# Patient Record
Sex: Female | Born: 1945 | Race: White | Marital: Married | Smoking: Never smoker
Health system: Southern US, Community
[De-identification: ages and names within clinical notes are randomized; demographics above are authoritative.]

## PROBLEM LIST (undated history)

## (undated) DIAGNOSIS — K659 Peritonitis, unspecified: Secondary | ICD-10-CM

## (undated) DIAGNOSIS — R42 Dizziness and giddiness: Secondary | ICD-10-CM

## (undated) HISTORY — DX: Peritonitis, unspecified: K65.9

## (undated) HISTORY — DX: Dizziness and giddiness: R42

## (undated) HISTORY — PX: ELBOW ARTHROTOMY: SUR103

## (undated) HISTORY — PX: TOE SURGERY: SHX1073

## (undated) HISTORY — PX: SHOULDER ARTHROSCOPY: SHX128

## (undated) HISTORY — PX: OTHER SURGICAL HISTORY: SHX169

---

## 2008-02-19 DIAGNOSIS — K659 Peritonitis, unspecified: Secondary | ICD-10-CM

## 2008-02-19 HISTORY — PX: COLECTOMY: SHX59

## 2008-02-19 HISTORY — PX: JOINT REPLACEMENT: SHX530

## 2008-02-19 HISTORY — DX: Peritonitis, unspecified: K65.9

## 2010-02-18 HISTORY — PX: JOINT REPLACEMENT: SHX530

## 2011-02-19 HISTORY — PX: JOINT REPLACEMENT: SHX530

## 2013-03-02 DIAGNOSIS — H04129 Dry eye syndrome of unspecified lacrimal gland: Secondary | ICD-10-CM | POA: Diagnosis not present

## 2013-03-02 DIAGNOSIS — H43819 Vitreous degeneration, unspecified eye: Secondary | ICD-10-CM | POA: Diagnosis not present

## 2013-03-02 DIAGNOSIS — H2510 Age-related nuclear cataract, unspecified eye: Secondary | ICD-10-CM | POA: Diagnosis not present

## 2013-06-14 DIAGNOSIS — L259 Unspecified contact dermatitis, unspecified cause: Secondary | ICD-10-CM | POA: Diagnosis not present

## 2013-07-14 DIAGNOSIS — L819 Disorder of pigmentation, unspecified: Secondary | ICD-10-CM | POA: Diagnosis not present

## 2013-11-18 DIAGNOSIS — R42 Dizziness and giddiness: Secondary | ICD-10-CM

## 2013-11-18 HISTORY — DX: Dizziness and giddiness: R42

## 2013-12-08 DIAGNOSIS — D692 Other nonthrombocytopenic purpura: Secondary | ICD-10-CM | POA: Diagnosis not present

## 2013-12-08 DIAGNOSIS — L821 Other seborrheic keratosis: Secondary | ICD-10-CM | POA: Diagnosis not present

## 2014-02-14 DIAGNOSIS — Z23 Encounter for immunization: Secondary | ICD-10-CM | POA: Diagnosis not present

## 2014-03-03 DIAGNOSIS — H43392 Other vitreous opacities, left eye: Secondary | ICD-10-CM | POA: Diagnosis not present

## 2014-03-03 DIAGNOSIS — H04123 Dry eye syndrome of bilateral lacrimal glands: Secondary | ICD-10-CM | POA: Diagnosis not present

## 2014-03-03 DIAGNOSIS — H2513 Age-related nuclear cataract, bilateral: Secondary | ICD-10-CM | POA: Diagnosis not present

## 2014-05-05 DIAGNOSIS — J019 Acute sinusitis, unspecified: Secondary | ICD-10-CM | POA: Diagnosis not present

## 2014-05-08 DIAGNOSIS — M1990 Unspecified osteoarthritis, unspecified site: Secondary | ICD-10-CM | POA: Diagnosis not present

## 2014-05-08 DIAGNOSIS — R42 Dizziness and giddiness: Secondary | ICD-10-CM | POA: Diagnosis not present

## 2014-05-08 DIAGNOSIS — H9319 Tinnitus, unspecified ear: Secondary | ICD-10-CM | POA: Diagnosis not present

## 2014-05-08 DIAGNOSIS — R112 Nausea with vomiting, unspecified: Secondary | ICD-10-CM | POA: Diagnosis not present

## 2014-05-08 DIAGNOSIS — Z136 Encounter for screening for cardiovascular disorders: Secondary | ICD-10-CM | POA: Diagnosis not present

## 2014-05-08 DIAGNOSIS — R0981 Nasal congestion: Secondary | ICD-10-CM | POA: Diagnosis not present

## 2014-05-09 DIAGNOSIS — R42 Dizziness and giddiness: Secondary | ICD-10-CM | POA: Diagnosis not present

## 2014-12-15 ENCOUNTER — Encounter: Payer: Self-pay | Admitting: Internal Medicine

## 2014-12-15 ENCOUNTER — Ambulatory Visit (INDEPENDENT_AMBULATORY_CARE_PROVIDER_SITE_OTHER): Payer: Medicare Other | Admitting: Internal Medicine

## 2014-12-15 VITALS — BP 124/76 | HR 76 | Temp 97.5°F | Resp 16 | Ht 63.0 in | Wt 149.8 lb

## 2014-12-15 DIAGNOSIS — R7309 Other abnormal glucose: Secondary | ICD-10-CM | POA: Diagnosis not present

## 2014-12-15 DIAGNOSIS — Z6826 Body mass index (BMI) 26.0-26.9, adult: Secondary | ICD-10-CM | POA: Diagnosis not present

## 2014-12-15 DIAGNOSIS — E559 Vitamin D deficiency, unspecified: Secondary | ICD-10-CM

## 2014-12-15 DIAGNOSIS — Z1389 Encounter for screening for other disorder: Secondary | ICD-10-CM

## 2014-12-15 DIAGNOSIS — IMO0001 Reserved for inherently not codable concepts without codable children: Secondary | ICD-10-CM

## 2014-12-15 DIAGNOSIS — R030 Elevated blood-pressure reading, without diagnosis of hypertension: Secondary | ICD-10-CM

## 2014-12-15 DIAGNOSIS — Z789 Other specified health status: Secondary | ICD-10-CM | POA: Diagnosis not present

## 2014-12-15 DIAGNOSIS — Z9181 History of falling: Secondary | ICD-10-CM

## 2014-12-15 DIAGNOSIS — Z79899 Other long term (current) drug therapy: Secondary | ICD-10-CM

## 2014-12-15 DIAGNOSIS — E7800 Pure hypercholesterolemia, unspecified: Secondary | ICD-10-CM | POA: Diagnosis not present

## 2014-12-15 DIAGNOSIS — Z1331 Encounter for screening for depression: Secondary | ICD-10-CM

## 2014-12-15 DIAGNOSIS — Z23 Encounter for immunization: Secondary | ICD-10-CM | POA: Diagnosis not present

## 2014-12-15 LAB — CBC WITH DIFFERENTIAL/PLATELET
BASOS ABS: 0.1 10*3/uL (ref 0.0–0.1)
Basophils Relative: 1 % (ref 0–1)
Eosinophils Absolute: 0.2 10*3/uL (ref 0.0–0.7)
Eosinophils Relative: 3 % (ref 0–5)
HCT: 38.2 % (ref 36.0–46.0)
HEMOGLOBIN: 12.2 g/dL (ref 12.0–15.0)
LYMPHS ABS: 1.5 10*3/uL (ref 0.7–4.0)
Lymphocytes Relative: 26 % (ref 12–46)
MCH: 24.1 pg — AB (ref 26.0–34.0)
MCHC: 31.9 g/dL (ref 30.0–36.0)
MCV: 75.3 fL — AB (ref 78.0–100.0)
MONOS PCT: 12 % (ref 3–12)
MPV: 9.7 fL (ref 8.6–12.4)
Monocytes Absolute: 0.7 10*3/uL (ref 0.1–1.0)
NEUTROS ABS: 3.4 10*3/uL (ref 1.7–7.7)
NEUTROS PCT: 58 % (ref 43–77)
PLATELETS: 291 10*3/uL (ref 150–400)
RBC: 5.07 MIL/uL (ref 3.87–5.11)
RDW: 19.4 % — ABNORMAL HIGH (ref 11.5–15.5)
WBC: 5.8 10*3/uL (ref 4.0–10.5)

## 2014-12-15 NOTE — Patient Instructions (Signed)

## 2014-12-16 LAB — BASIC METABOLIC PANEL WITH GFR
BUN: 11 mg/dL (ref 7–25)
CALCIUM: 9.4 mg/dL (ref 8.6–10.4)
CHLORIDE: 103 mmol/L (ref 98–110)
CO2: 28 mmol/L (ref 20–31)
CREATININE: 0.69 mg/dL (ref 0.60–0.93)
GFR, Est African American: >89 mL/min (ref >=60)
GFR, Est Non African American: 88 mL/min (ref >=60)
Glucose, Bld: 88 mg/dL (ref 65–99)
POTASSIUM: 4.1 mmol/L (ref 3.5–5.3)
SODIUM: 140 mmol/L (ref 135–146)

## 2014-12-16 LAB — LIPID PANEL
CHOL/HDL RATIO: 4.2 ratio (ref <=5.0)
CHOLESTEROL: 201 mg/dL — AB (ref 125–200)
HDL: 48 mg/dL (ref >=46)
LDL Cholesterol: 134 mg/dL — ABNORMAL HIGH (ref <130)
Triglycerides: 97 mg/dL (ref <150)
VLDL: 19 mg/dL (ref <30)

## 2014-12-16 LAB — HEPATIC FUNCTION PANEL
ALT: 17 U/L (ref 6–29)
AST: 18 U/L (ref 10–35)
Albumin: 4 g/dL (ref 3.6–5.1)
Alkaline Phosphatase: 48 U/L (ref 33–130)
BILIRUBIN DIRECT: 0.1 mg/dL (ref <=0.2)
BILIRUBIN INDIRECT: 0.2 mg/dL (ref 0.2–1.2)
BILIRUBIN TOTAL: 0.3 mg/dL (ref 0.2–1.2)
Total Protein: 6.6 g/dL (ref 6.1–8.1)

## 2014-12-16 LAB — INSULIN, RANDOM: Insulin: 8.1 u[IU]/mL (ref 2.0–19.6)

## 2014-12-16 LAB — URINALYSIS, ROUTINE W REFLEX MICROSCOPIC
BILIRUBIN URINE: NEGATIVE
Glucose, UA: NEGATIVE
Hgb urine dipstick: NEGATIVE
KETONES UR: NEGATIVE
Leukocytes, UA: NEGATIVE
Nitrite: NEGATIVE
PROTEIN: NEGATIVE
Specific Gravity, Urine: 1.011 (ref 1.001–1.035)
pH: 5.5 (ref 5.0–8.0)

## 2014-12-16 LAB — TSH: TSH: 1.286 u[IU]/mL (ref 0.350–4.500)

## 2014-12-16 LAB — HEMOGLOBIN A1C
Hgb A1c MFr Bld: 5.9 % — ABNORMAL HIGH (ref <5.7)
Mean Plasma Glucose: 123 mg/dL — ABNORMAL HIGH (ref <117)

## 2014-12-16 LAB — VITAMIN D 25 HYDROXY (VIT D DEFICIENCY, FRACTURES): VIT D 25 HYDROXY: 44 ng/mL (ref 30–100)

## 2014-12-16 LAB — MAGNESIUM: MAGNESIUM: 2.1 mg/dL (ref 1.5–2.5)

## 2014-12-24 ENCOUNTER — Encounter: Payer: Self-pay | Admitting: Internal Medicine

## 2014-12-24 DIAGNOSIS — E559 Vitamin D deficiency, unspecified: Secondary | ICD-10-CM | POA: Insufficient documentation

## 2014-12-24 DIAGNOSIS — E7800 Pure hypercholesterolemia, unspecified: Secondary | ICD-10-CM | POA: Insufficient documentation

## 2014-12-24 DIAGNOSIS — I10 Essential (primary) hypertension: Secondary | ICD-10-CM | POA: Insufficient documentation

## 2014-12-24 DIAGNOSIS — R7309 Other abnormal glucose: Secondary | ICD-10-CM | POA: Insufficient documentation

## 2014-12-24 DIAGNOSIS — Z6826 Body mass index (BMI) 26.0-26.9, adult: Secondary | ICD-10-CM | POA: Insufficient documentation

## 2014-12-24 MED ORDER — VITAMIN D3 250 MCG (10000 UT) PO CAPS: 10000.0000 [IU] | ORAL_CAPSULE | Freq: Every day | ORAL | Status: AC

## 2014-12-24 NOTE — Progress Notes (Signed)
Patient ID: Heather Barrera, female   DOB: Dec 13, 1945, 69 y.o.   MRN: [id number]     This very nice 69 y.o. MWF presents for new patient evaluation referred by her husband.   She's being screened & evaluated for elevated BP, lipids, abnormal glucose and Vit D deficiency.      Patient is uncertain if ever had elevated BP's. Today's BP: 124/76 mmHg. Patient has had no complaints of any cardiac type chest pain, palpitations, dyspnea/orthopnea/PND, dizziness, claudication, or dependent edema.     Patient does endorse hx/o advisement of elevated cholesterol in the past. And it was last checked about 2 years ago.  Patient denies myalgias or other med SE's. Today's Lipids are elevated with Cholesterol 201*; HDL 48; LDL 134*; and Triglycerides 97.     Also, the patient has uncertain history of PreDiabetes and denies symptoms of reactive hypoglycemia, diabetic polys, paresthesias or visual blurring.  Today's  A1c is elevated at 5.9% in the prediabetic range.        Further, the patient also has history of Vitamin D Deficiency and supplements vitamin D without any suspected side-effects. Today's vitamin D is low at 44.      Current Meds include Melatonin 8 mg qhs, 1/2 oz of ZZZQuil qhs, ASA 325 mg, Calcium-Magnesium-Zinc Supplement and Aleve prn.  Allergies  Allergen Reactions  . Penicillins Anaphylaxis  . Demerol [Meperidine] Nausea And Vomiting  . Betadine [Povidone Iodine] Rash    Past Medical History  Diagnosis Date  . Peritonitis (HCC) 2010    due to SBO  . Vertigo Oct 2015   Past Surgical History  Procedure Laterality Date  . Shoulder arthroscopy Left   . Elbow arthrotomy Left   . 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 Joint replacement Left 2012    Lt TKR  . Joint replacement Right 2013    Rt THR  . Thumb surg Right   . Toe surgery Bilateral     5th toes    Immunization History  Administered Date(s) Administered  . Influenza, High Dose Seasonal PF 12/15/2014  . Pneumococcal  Polysaccharide-23 02/18/2009  . Zoster 02/18/2009    FHx:    Reviewed / unchanged  SHx:    Reviewed / unchanged  Systems Review:  Constitutional: Denies fever, chills, wt changes, headaches, insomnia,  night sweats, change in appetite. Does c/o nonspecific fatigue. Eyes: Denies redness, blurred vision, diplopia, discharge, itchy, watery eyes.  ENT: Denies discharge, congestion, post nasal drip, epistaxis, sore throat, earache,  dental pain, tinnitus, vertigo, sinus pain, snoring. Reports decreased Lt sided hearing loss. CV: Denies chest pain, palpitations, irregular heartbeat, syncope, dyspnea, diaphoresis, orthopnea, PND, claudication or edema. Respiratory: denies cough, dyspnea, DOE, pleurisy, hoarseness, laryngitis, wheezing.  Gastrointestinal: Denies dysphagia, odynophagia, heartburn, reflux, water brash, abdominal pain or cramps, nausea, vomiting, bloating, diarrhea, constipation, hematemesis, melena, hematochezia  or hemorrhoids. Genitourinary: Denies dysuria, frequency, urgency, nocturia, hesitancy, discharge, hematuria or flank pain. Musculoskeletal: Denies arthralgias, myalgias, stiffness, jt. swelling, pain, limping or strain/sprain. Denies falls.  Skin: Denies pruritus, rash, hives, warts, acne, eczema or change in skin lesion(s). Neuro: No weakness, tremor, incoordination, spasms, paresthesia or pain. Psychiatric: Denies confusion, memory loss or sensory loss. Denies depression.  Endo: Denies change in weight, skin or hair change.  Heme/Lymph: No excessive bleeding, bruising or enlarged lymph nodes.  Physical Exam  BP 124/76 mmHg  Pulse 76  Temp(Src) 97.5 F (36.4 C)  Resp 16  Ht 5\' 3"  (1.6 m)  Wt 149  lb 12.8 oz (67.949 kg)  BMI 26.54 kg/m2  Appears well nourished and in no distress. Eyes: PERRLA, EOMs, conjunctiva no swelling or erythema. Sinuses: No frontal/maxillary tenderness ENT/Mouth: EAC's clear, TM's nl w/o erythema, bulging. Nares clear w/o erythema,  swelling, exudates. Oropharynx clear without erythema or exudates. Oral hygiene is good. Tongue normal, non obstructing. Hearing intact.  Neck: Supple. Thyroid nl. Car 2+/2+ without bruits, nodes or JVD. Chest: Respirations nl with BS clear & equal w/o rales, rhonchi, wheezing or stridor.  Cor: Heart sounds normal w/ regular rate and rhythm without sig. murmurs, gallops, clicks, or rubs. Peripheral pulses normal and equal  without edema.  Abdomen: Soft & bowel sounds normal. Non-tender w/o guarding, rebound, hernias, masses, or organomegaly.  Lymphatics: Unremarkable.  Musculoskeletal: Full ROM all peripheral extremities, joint stability, 5/5 strength, and normal gait.  Skin: Warm, dry without exposed rashes, lesions or ecchymosis apparent.  Neuro: Cranial nerves intact, reflexes equal bilaterally. Sensory-motor testing grossly intact. Monofilament testing Nl.  Tendon reflexes grossly intact.  Pysch: Alert & oriented x 3.  Insight and judgement nl & appropriate. No ideations.  Assessment and Plan:  1. Elevated BP, screening   2. Elevated cholesterol, screening  - Lipid panel - TSH  3. PreDiabetes  - Hemoglobin A1c - Insulin, random  4. Vitamin D deficiency  - Vit D  25 hydroxy   5. Need for prophylactic vaccination and inoculation against influenza  - Flu vaccine HIGH DOSE PF (Fluzone High dose)  6. BMI 26.0-26.9,adult   7. Medication management  - CBC with Differential/Platelet - BASIC METABOLIC PANEL WITH GFR - Hepatic function panel - Magnesium - Urinalysis, Routine w reflex microscopic    Recommended regular exercise, BP monitoring, weight control, and discussed med and SE's. Recommended labs to assess and monitor clinical status. Further disposition pending results of labs. Over 30 minutes of exam, counseling, chart review was performed

## 2015-03-23 ENCOUNTER — Ambulatory Visit: Payer: Self-pay | Admitting: Physician Assistant

## 2015-03-23 ENCOUNTER — Ambulatory Visit (INDEPENDENT_AMBULATORY_CARE_PROVIDER_SITE_OTHER): Payer: Medicare Other | Admitting: Internal Medicine

## 2015-03-23 ENCOUNTER — Encounter: Payer: Self-pay | Admitting: Internal Medicine

## 2015-03-23 VITALS — BP 136/80 | HR 82 | Temp 97.8°F | Resp 16 | Ht 63.0 in | Wt 155.0 lb

## 2015-03-23 DIAGNOSIS — Z79899 Other long term (current) drug therapy: Secondary | ICD-10-CM | POA: Diagnosis not present

## 2015-03-23 DIAGNOSIS — IMO0001 Reserved for inherently not codable concepts without codable children: Secondary | ICD-10-CM

## 2015-03-23 DIAGNOSIS — E7800 Pure hypercholesterolemia, unspecified: Secondary | ICD-10-CM

## 2015-03-23 DIAGNOSIS — R030 Elevated blood-pressure reading, without diagnosis of hypertension: Secondary | ICD-10-CM | POA: Diagnosis not present

## 2015-03-23 LAB — LIPID PANEL
CHOL/HDL RATIO: 3.5 ratio (ref <=5.0)
CHOLESTEROL: 213 mg/dL — AB (ref 125–200)
HDL: 61 mg/dL (ref >=46)
LDL Cholesterol: 136 mg/dL — ABNORMAL HIGH (ref <130)
TRIGLYCERIDES: 78 mg/dL (ref <150)
VLDL: 16 mg/dL (ref <30)

## 2015-03-23 LAB — HEPATIC FUNCTION PANEL
ALK PHOS: 51 U/L (ref 33–130)
ALT: 20 U/L (ref 6–29)
AST: 24 U/L (ref 10–35)
Albumin: 4.2 g/dL (ref 3.6–5.1)
BILIRUBIN DIRECT: 0.1 mg/dL (ref <=0.2)
BILIRUBIN TOTAL: 0.3 mg/dL (ref 0.2–1.2)
Indirect Bilirubin: 0.2 mg/dL (ref 0.2–1.2)
Total Protein: 6.6 g/dL (ref 6.1–8.1)

## 2015-03-23 LAB — CBC WITH DIFFERENTIAL/PLATELET
BASOS ABS: 0.1 10*3/uL (ref 0.0–0.1)
Basophils Relative: 1 % (ref 0–1)
EOS ABS: 0.1 10*3/uL (ref 0.0–0.7)
Eosinophils Relative: 2 % (ref 0–5)
HCT: 41.1 % (ref 36.0–46.0)
HEMOGLOBIN: 13.2 g/dL (ref 12.0–15.0)
LYMPHS ABS: 1.5 10*3/uL (ref 0.7–4.0)
Lymphocytes Relative: 28 % (ref 12–46)
MCH: 26.6 pg (ref 26.0–34.0)
MCHC: 32.1 g/dL (ref 30.0–36.0)
MCV: 82.7 fL (ref 78.0–100.0)
MONOS PCT: 14 % — AB (ref 3–12)
MPV: 10 fL (ref 8.6–12.4)
Monocytes Absolute: 0.8 10*3/uL (ref 0.1–1.0)
NEUTROS ABS: 3 10*3/uL (ref 1.7–7.7)
NEUTROS PCT: 55 % (ref 43–77)
PLATELETS: 308 10*3/uL (ref 150–400)
RBC: 4.97 MIL/uL (ref 3.87–5.11)
RDW: 17.8 % — ABNORMAL HIGH (ref 11.5–15.5)
WBC: 5.4 10*3/uL (ref 4.0–10.5)

## 2015-03-23 LAB — BASIC METABOLIC PANEL WITH GFR
BUN: 10 mg/dL (ref 7–25)
CHLORIDE: 104 mmol/L (ref 98–110)
CO2: 28 mmol/L (ref 20–31)
Calcium: 9.7 mg/dL (ref 8.6–10.4)
Creat: 0.71 mg/dL (ref 0.60–0.93)
GFR, Est African American: >89 mL/min (ref >=60)
GFR, Est Non African American: 86 mL/min (ref >=60)
Glucose, Bld: 94 mg/dL (ref 65–99)
POTASSIUM: 4.2 mmol/L (ref 3.5–5.3)
SODIUM: 142 mmol/L (ref 135–146)

## 2015-03-23 LAB — TSH: TSH: 2.632 u[IU]/mL (ref 0.350–4.500)

## 2015-03-23 NOTE — Progress Notes (Signed)
Patient ID: Heather Barrera, female   DOB: 16-Oct-1945, 70 y.o.   MRN: [id number]  Assessment and Plan:  Hypertension:  -Continue medication,  -monitor blood pressure at home.  -Continue DASH diet.   -Reminder to go to the ER if any CP, SOB, nausea, dizziness, severe HA, changes vision/speech, left arm numbness and tingling, and jaw pain.  Cholesterol: -Continue diet and exercise.  -Check cholesterol.   Pre-diabetes: -Continue diet and exercise.  -Check A1C  Vitamin D Def: -check level -continue medications.   Continue diet and meds as discussed. Further disposition pending results of labs.  HPI 70 y.o. female  presents for 3 month follow up with hypertension, hyperlipidemia, prediabetes and vitamin D.   Her blood pressure has been controlled at home, today their BP is BP: 136/80 mmHg.   She does workout. She denies chest pain, shortness of breath, dizziness.  She is walking and she is using a treadmill.  She is working to lose weight.     She is not on cholesterol medication and denies myalgias. Her cholesterol is at goal. The cholesterol last visit was:   Lab Results  Component Value Date   CHOL 201* 12/15/2014   HDL 48 12/15/2014   LDLCALC 134* 12/15/2014   TRIG 97 12/15/2014   CHOLHDL 4.2 12/15/2014     She has been working on diet and exercise for prediabetes, and denies foot ulcerations, hyperglycemia, hypoglycemia , increased appetite, nausea, paresthesia of the feet, polydipsia, polyuria, visual disturbances, vomiting and weight loss. Last A1C in the office was:  Lab Results  Component Value Date   HGBA1C 5.9* 12/15/2014    Patient is on Vitamin D supplement.  Lab Results  Component Value Date   VD25OH 44 12/15/2014      Current Medications:  Current Outpatient Prescriptions on File Prior to Visit  Medication Sig Dispense Refill  . aspirin 325 MG EC tablet Take 325 mg by mouth daily.    . Cholecalciferol (VITAMIN D3) 10000 UNITS capsule Take 1 capsule (10,000  Units total) by mouth daily.    . Ferrous Fumarate (IRON) 18 MG TBCR Take 1 tablet by mouth daily.    Marland Kitchen GARLIC PO Take 1 capsule by mouth daily.    Marland Kitchen MELATONIN PO Take 8 mg by mouth daily. At bedtime    . Multiple Vitamins-Minerals (MULTIVITAMIN PO) Take 1 tablet by mouth daily.    . naproxen sodium (ANAPROX) 220 MG tablet Take 220 mg by mouth 2 (two) times daily with a meal. PRN    . OVER THE COUNTER MEDICATION CoQ10 200 mg daily    . OVER THE COUNTER MEDICATION Z Quil 1/2 ounce at hs    . OVER THE COUNTER MEDICATION Calcium-magnesium-zinc 1 tablet daily.    . vitamin E 400 UNIT capsule Take 400 Units by mouth daily.     No current facility-administered medications on file prior to visit.    Medical History:  Past Medical History  Diagnosis Date  . Peritonitis (HCC) 2010    due to SBO  . Vertigo Oct 2015    Allergies:  Allergies  Allergen Reactions  . Penicillins Anaphylaxis  . Demerol [Meperidine] Nausea And Vomiting  . Betadine [Povidone Iodine] Rash     Review of Systems:  Review of Systems  Constitutional: Negative for fever, chills and malaise/fatigue.  HENT: Negative for congestion, ear pain and sore throat.   Respiratory: Negative for cough, shortness of breath and wheezing.   Cardiovascular: Negative for chest pain, palpitations and  leg swelling.  Gastrointestinal: Negative for heartburn, diarrhea, constipation, blood in stool and melena.  Genitourinary: Negative.   Skin: Negative.   Neurological: Negative for dizziness, sensory change, loss of consciousness and headaches.  Psychiatric/Behavioral: Negative for depression. The patient is not nervous/anxious and does not have insomnia.     Family history- Review and unchanged  Social history- Review and unchanged  Physical Exam: BP 136/80 mmHg  Pulse 82  Temp(Src) 97.8 F (36.6 C) (Temporal)  Resp 16  Ht  (1.6 m)  Wt 155 lb (70.308 kg)  BMI 27.46 kg/m2 Wt Readings from Last 3 Encounters:  03/23/15  155 lb (70.308 kg)  12/15/14 149 lb 12.8 oz (67.949 kg)    General Appearance: Well nourished well developed, in no apparent distress. Eyes: PERRLA, EOMs, conjunctiva no swelling or erythema ENT/Mouth: Ear canals normal without obstruction, swelling, erythma, discharge.  TMs normal bilaterally.  Oropharynx moist, clear, without exudate, or postoropharyngeal swelling. Neck: Supple, thyroid normal,no cervical adenopathy  Respiratory: Respiratory effort normal, Breath sounds clear A&P without rhonchi, wheeze, or rale.  No retractions, no accessory usage. Cardio: RRR with no MRGs. Brisk peripheral pulses without edema.  Abdomen: Soft, + BS,  Non tender, no guarding, rebound, hernias, masses. Musculoskeletal: Full ROM, 5/5 strength, Normal gait Skin: Warm, dry without rashes, lesions, ecchymosis.  Neuro: Awake and oriented X 3, Cranial nerves intact. Normal muscle tone, no cerebellar symptoms. Psych: Normal affect, Insight and Judgment appropriate.    Terri Piedra, PA-C 3:34 PM [facility]

## 2015-04-18 DIAGNOSIS — H903 Sensorineural hearing loss, bilateral: Secondary | ICD-10-CM | POA: Diagnosis not present

## 2015-10-05 ENCOUNTER — Ambulatory Visit (INDEPENDENT_AMBULATORY_CARE_PROVIDER_SITE_OTHER): Payer: Medicare Other | Admitting: Internal Medicine

## 2015-10-05 ENCOUNTER — Encounter: Payer: Self-pay | Admitting: Internal Medicine

## 2015-10-05 VITALS — BP 126/70 | HR 62 | Resp 14 | Ht 63.0 in | Wt 149.8 lb

## 2015-10-05 DIAGNOSIS — Z79899 Other long term (current) drug therapy: Secondary | ICD-10-CM | POA: Diagnosis not present

## 2015-10-05 DIAGNOSIS — Z23 Encounter for immunization: Secondary | ICD-10-CM | POA: Diagnosis not present

## 2015-10-05 DIAGNOSIS — Z6826 Body mass index (BMI) 26.0-26.9, adult: Secondary | ICD-10-CM

## 2015-10-05 DIAGNOSIS — R7309 Other abnormal glucose: Secondary | ICD-10-CM

## 2015-10-05 DIAGNOSIS — Z1159 Encounter for screening for other viral diseases: Secondary | ICD-10-CM | POA: Diagnosis not present

## 2015-10-05 DIAGNOSIS — E7800 Pure hypercholesterolemia, unspecified: Secondary | ICD-10-CM

## 2015-10-05 DIAGNOSIS — R030 Elevated blood-pressure reading, without diagnosis of hypertension: Secondary | ICD-10-CM

## 2015-10-05 DIAGNOSIS — E559 Vitamin D deficiency, unspecified: Secondary | ICD-10-CM | POA: Diagnosis not present

## 2015-10-05 DIAGNOSIS — IMO0001 Reserved for inherently not codable concepts without codable children: Secondary | ICD-10-CM

## 2015-10-05 LAB — LIPID PANEL
CHOL/HDL RATIO: 3.3 ratio (ref <=5.0)
CHOLESTEROL: 190 mg/dL (ref 125–200)
HDL: 58 mg/dL (ref >=46)
LDL Cholesterol: 115 mg/dL (ref <130)
TRIGLYCERIDES: 84 mg/dL (ref <150)
VLDL: 17 mg/dL (ref <30)

## 2015-10-05 LAB — HEMOGLOBIN A1C
Hgb A1c MFr Bld: 5.2 % (ref <5.7)
MEAN PLASMA GLUCOSE: 103 mg/dL

## 2015-10-05 LAB — BASIC METABOLIC PANEL WITH GFR
BUN: 15 mg/dL (ref 7–25)
CALCIUM: 9.8 mg/dL (ref 8.6–10.4)
CHLORIDE: 105 mmol/L (ref 98–110)
CO2: 24 mmol/L (ref 20–31)
CREATININE: 0.72 mg/dL (ref 0.60–0.93)
GFR, Est Non African American: 85 mL/min (ref >=60)
Glucose, Bld: 81 mg/dL (ref 65–99)
Potassium: 4.1 mmol/L (ref 3.5–5.3)
SODIUM: 139 mmol/L (ref 135–146)

## 2015-10-05 LAB — CBC WITH DIFFERENTIAL/PLATELET
BASOS ABS: 0 {cells}/uL (ref 0–200)
Basophils Relative: 0 %
EOS ABS: 94 {cells}/uL (ref 15–500)
EOS PCT: 2 %
HCT: 41.4 % (ref 35.0–45.0)
Hemoglobin: 13.7 g/dL (ref 11.7–15.5)
LYMPHS PCT: 18 %
Lymphs Abs: 846 cells/uL — ABNORMAL LOW (ref 850–3900)
MCH: 29.7 pg (ref 27.0–33.0)
MCHC: 33.1 g/dL (ref 32.0–36.0)
MCV: 89.8 fL (ref 80.0–100.0)
MONOS PCT: 15 %
MPV: 9.9 fL (ref 7.5–12.5)
Monocytes Absolute: 705 cells/uL (ref 200–950)
NEUTROS ABS: 3055 {cells}/uL (ref 1500–7800)
Neutrophils Relative %: 65 %
PLATELETS: 275 10*3/uL (ref 140–400)
RBC: 4.61 MIL/uL (ref 3.80–5.10)
RDW: 16.6 % — AB (ref 11.0–15.0)
WBC: 4.7 10*3/uL (ref 3.8–10.8)

## 2015-10-05 LAB — HEPATIC FUNCTION PANEL
ALBUMIN: 4.2 g/dL (ref 3.6–5.1)
ALT: 17 U/L (ref 6–29)
AST: 22 U/L (ref 10–35)
Alkaline Phosphatase: 36 U/L (ref 33–130)
BILIRUBIN DIRECT: 0.1 mg/dL (ref <=0.2)
BILIRUBIN TOTAL: 0.4 mg/dL (ref 0.2–1.2)
Indirect Bilirubin: 0.3 mg/dL (ref 0.2–1.2)
Total Protein: 7 g/dL (ref 6.1–8.1)

## 2015-10-05 LAB — TSH: TSH: 2.88 mIU/L

## 2015-10-05 NOTE — Progress Notes (Signed)
Assessment and Plan:  Hypertension:  -Continue medication,  -monitor blood pressure at home.  -Continue DASH diet.   -Reminder to go to the ER if any CP, SOB, nausea, dizziness, severe HA, changes vision/speech, left arm numbness and tingling, and jaw pain.  Cholesterol: -Continue diet and exercise.  -Check cholesterol.   Pre-diabetes: -Continue diet and exercise.  -Check A1C  Vitamin D Def: -check level -continue medications.   Hepatitis C screening -hep c antibody  BMI 26 -cont diet and exercise -refused phentermine prescription  Continue diet and meds as discussed. Further disposition pending results of labs.  HPI 70 y.o. female  presents for 3 month follow up with hypertension, hyperlipidemia, prediabetes and vitamin D.   Her blood pressure has been controlled at home, today their BP is BP: 126/70.   She does workout. She denies chest pain, shortness of breath, dizziness.   She is on cholesterol medication and denies myalgias. Her cholesterol is at goal. The cholesterol last visit was:   Lab Results  Component Value Date   CHOL 213 (H) 03/23/2015   HDL 61 03/23/2015   LDLCALC 136 (H) 03/23/2015   TRIG 78 03/23/2015   CHOLHDL 3.5 03/23/2015     She has been working on diet and exercise for prediabetes, and denies foot ulcerations, hyperglycemia, hypoglycemia , increased appetite, nausea, paresthesia of the feet, polydipsia, polyuria, visual disturbances, vomiting and weight loss. Last A1C in the office was:  Lab Results  Component Value Date   HGBA1C 5.9 (H) 12/15/2014    Patient is on Vitamin D supplement.  Lab Results  Component Value Date   VD25OH 44 12/15/2014     She has no complaints today.  She would like to go ahead and have the hepatitis C test she has been seeing on TV.  She is working on losing weight for an upcoming wedding for one of her grandkids   Current Medications:  Current Outpatient Prescriptions on File Prior to Visit  Medication  Sig Dispense Refill  . aspirin 325 MG EC tablet Take 325 mg by mouth daily.    . Cholecalciferol (VITAMIN D3) 10000 UNITS capsule Take 1 capsule (10,000 Units total) by mouth daily.    . Ferrous Fumarate (IRON) 18 MG TBCR Take 1 tablet by mouth daily.    Marland Kitchen. GARLIC PO Take 1 capsule by mouth daily.    Marland Kitchen. MELATONIN PO Take 8 mg by mouth daily. At bedtime    . Multiple Vitamins-Minerals (MULTIVITAMIN PO) Take 1 tablet by mouth daily.    . naproxen sodium (ANAPROX) 220 MG tablet Take 220 mg by mouth 2 (two) times daily with a meal. PRN    . OVER THE COUNTER MEDICATION CoQ10 200 mg daily    . OVER THE COUNTER MEDICATION Z Quil 1/2 ounce at hs    . OVER THE COUNTER MEDICATION Calcium-magnesium-zinc 1 tablet daily.    . vitamin E 400 UNIT capsule Take 400 Units by mouth daily.     No current facility-administered medications on file prior to visit.     Medical History:  Past Medical History:  Diagnosis Date  . Peritonitis (HCC) 2010   due to SBO  . Vertigo Oct 2015    Allergies:  Allergies  Allergen Reactions  . Penicillins Anaphylaxis  . Demerol [Meperidine] Nausea And Vomiting  . Betadine [Povidone Iodine] Rash     Review of Systems:  Review of Systems  Constitutional: Negative for chills, fever and malaise/fatigue.  HENT: Negative for congestion, ear  pain and sore throat.   Eyes: Negative.   Respiratory: Negative for cough, shortness of breath and wheezing.   Cardiovascular: Negative for chest pain, palpitations and leg swelling.  Gastrointestinal: Negative for abdominal pain, blood in stool, constipation, diarrhea, heartburn and melena.  Genitourinary: Negative.   Skin: Negative.   Neurological: Negative for dizziness, sensory change, loss of consciousness and headaches.  Psychiatric/Behavioral: Negative for depression. The patient is not nervous/anxious and does not have insomnia.     Family history- Review and unchanged  Social history- Review and unchanged  Physical  Exam: BP 126/70   Pulse 62   Resp 14   Ht 5\' 3"  (1.6 m)   Wt 149 lb 12.8 oz (67.9 kg)   SpO2 98%   BMI 26.54 kg/m  Wt Readings from Last 3 Encounters:  10/05/15 149 lb 12.8 oz (67.9 kg)  03/23/15 155 lb (70.3 kg)  12/15/14 149 lb 12.8 oz (67.9 kg)    General Appearance: Well nourished well developed, in no apparent distress. Eyes: PERRLA, EOMs, conjunctiva no swelling or erythema ENT/Mouth: Ear canals normal without obstruction, swelling, erythma, discharge.  TMs normal bilaterally.  Oropharynx moist, clear, without exudate, or postoropharyngeal swelling. Neck: Supple, thyroid normal,no cervical adenopathy  Respiratory: Respiratory effort normal, Breath sounds clear A&P without rhonchi, wheeze, or rale.  No retractions, no accessory usage. Cardio: RRR with no MRGs. Brisk peripheral pulses without edema.  Abdomen: Soft, + BS,  Non tender, no guarding, rebound, hernias, masses. Musculoskeletal: Full ROM, 5/5 strength, Normal gait Skin: Warm, dry without rashes, lesions, ecchymosis.  Neuro: Awake and oriented X 3, Cranial nerves intact. Normal muscle tone, no cerebellar symptoms. Psych: Normal affect, Insight and Judgment appropriate.    Terri Piedraourtney Forcucci, PA-C 4:16 PM [facility]

## 2015-10-06 LAB — HEPATITIS C ANTIBODY: HCV Ab: NEGATIVE

## 2016-03-13 DIAGNOSIS — M25551 Pain in right hip: Secondary | ICD-10-CM | POA: Diagnosis not present

## 2016-03-13 DIAGNOSIS — M25571 Pain in right ankle and joints of right foot: Secondary | ICD-10-CM | POA: Diagnosis not present

## 2016-03-13 DIAGNOSIS — M25561 Pain in right knee: Secondary | ICD-10-CM | POA: Diagnosis not present

## 2016-03-20 DIAGNOSIS — M25551 Pain in right hip: Secondary | ICD-10-CM | POA: Diagnosis not present

## 2016-03-20 DIAGNOSIS — R6889 Other general symptoms and signs: Secondary | ICD-10-CM | POA: Diagnosis not present

## 2016-03-27 DIAGNOSIS — R6889 Other general symptoms and signs: Secondary | ICD-10-CM | POA: Diagnosis not present

## 2016-03-27 DIAGNOSIS — M25551 Pain in right hip: Secondary | ICD-10-CM | POA: Diagnosis not present

## 2016-04-03 DIAGNOSIS — R6889 Other general symptoms and signs: Secondary | ICD-10-CM | POA: Diagnosis not present

## 2016-04-03 DIAGNOSIS — M25551 Pain in right hip: Secondary | ICD-10-CM | POA: Diagnosis not present

## 2016-04-24 DIAGNOSIS — M25551 Pain in right hip: Secondary | ICD-10-CM | POA: Diagnosis not present

## 2016-06-03 ENCOUNTER — Ambulatory Visit: Payer: Self-pay | Admitting: Internal Medicine

## 2016-06-05 ENCOUNTER — Ambulatory Visit: Payer: Self-pay | Admitting: Physician Assistant

## 2016-06-16 NOTE — Progress Notes (Signed)
MEDICARE ANNUAL WELLNESS VISIT AND CPE  Assessment:    Hypertension, unspecified type - continue medications, DASH diet, exercise and monitor at home. Call if greater than 130/80.  -     CBC with Differential/Platelet -     BASIC METABOLIC PANEL WITH GFR -     Hepatic function panel -     TSH  Elevated cholesterol -continue medications, check lipids, decrease fatty foods, increase activity.  -     Lipid panel  Other abnormal glucose Discussed general issues about diabetes pathophysiology and management., Educational material distributed., Suggested low cholesterol diet., Encouraged aerobic exercise., Discussed foot care., Reminded to get yearly retinal exam.  Vitamin D deficiency  BMI 26.0-26.9,adult  Medication management -     Magnesium  Encounter for Medicare annual wellness exam  Estrogen deficiency -     DG Bone Density; Future  Venous stasis dermatitis of right lower extremity -     triamcinolone cream (KENALOG) 0.5 %; Apply 1 application topically 2 (two) times daily.   Over 40 minutes of exam, counseling, chart review and critical decision making was performed No future appointments.   Plan:   During the course of the visit the patient was educated and counseled about appropriate screening and preventive services including:    Pneumococcal vaccine   Prevnar 13  Influenza vaccine  Td vaccine  Screening electrocardiogram  Bone densitometry screening  Colorectal cancer screening  Diabetes screening  Glaucoma screening  Nutrition counseling   Advanced directives: requested   Subjective:  Heather Barrera is a 71 y.o. female who presents for Medicare Annual Wellness Visit and complete physical.    May had fall, saw Dr. Glynda Jaeger, got cortisone injection in bursa which helped.  Her blood pressure has been controlled at home, today their BP is BP: 120/74  She does workout. She denies chest pain, shortness of breath, dizziness.  She is not on  cholesterol medication and denies myalgias. Her cholesterol is at goal. The cholesterol last visit was:   Lab Results  Component Value Date   CHOL 190 10/05/2015   HDL 58 10/05/2015   LDLCALC 115 10/05/2015   TRIG 84 10/05/2015   CHOLHDL 3.3 10/05/2015    She has been working on diet and exercise for prediabetes, and denies paresthesia of the feet, polydipsia, polyuria and visual disturbances. Last A1C in the office was:  Lab Results  Component Value Date   HGBA1C 5.2 10/05/2015   Last GFR:  Lab Results  Component Value Date   GFRNONAA 85 10/05/2015   Patient is on Vitamin D supplement.   Lab Results  Component Value Date   VD25OH 44 12/15/2014     BMI is Body mass index is 26.47 kg/m., she is working on diet and exercise. Wt Readings from Last 3 Encounters:  06/17/16 149 lb 6.4 oz (67.8 kg)  10/05/15 149 lb 12.8 oz (67.9 kg)  03/23/15 155 lb (70.3 kg)    Medication Review: Current Outpatient Prescriptions on File Prior to Visit  Medication Sig Dispense Refill  . aspirin 325 MG EC tablet Take 325 mg by mouth daily.    . Cholecalciferol (VITAMIN D3) 10000 UNITS capsule Take 1 capsule (10,000 Units total) by mouth daily.    . Ferrous Fumarate (IRON) 18 MG TBCR Take 1 tablet by mouth daily.    Marland Kitchen GARLIC PO Take 1 capsule by mouth daily.    . Multiple Vitamins-Minerals (MULTIVITAMIN PO) Take 1 tablet by mouth daily.    . naproxen sodium (ANAPROX)  220 MG tablet Take 220 mg by mouth 2 (two) times daily with a meal. PRN    . OVER THE COUNTER MEDICATION CoQ10 200 mg daily    . OVER THE COUNTER MEDICATION Z Quil 1/2 ounce at hs    . OVER THE COUNTER MEDICATION Calcium-magnesium-zinc 1 tablet daily.    . vitamin E 400 UNIT capsule Take 400 Units by mouth daily.     No current facility-administered medications on file prior to visit.     Allergies  Allergen Reactions  . Penicillins Anaphylaxis  . Demerol [Meperidine] Nausea And Vomiting  . Betadine [Povidone Iodine] Rash     Current Problems (verified) Patient Active Problem List   Diagnosis Date Noted  . Hypertension 12/24/2014  . Elevated cholesterol 12/24/2014  . Other abnormal glucose 12/24/2014  . Vitamin D deficiency 12/24/2014  . BMI 26.0-26.9,adult 12/24/2014    Screening Tests Immunization History  Administered Date(s) Administered  . Influenza, High Dose Seasonal PF 12/15/2014, 10/05/2015  . Pneumococcal Conjugate-13 10/05/2015  . Pneumococcal Polysaccharide-23 02/18/2009  . Zoster 02/18/2009    Preventative care: Last colonoscopy: 2010, had ruptured colon and had colectomy Last mammogram: 5 years  Last pap smear/pelvic exam: remote   DEXA:   Prior vaccinations: TD or Tdap: declines  Influenza: 2017 Pneumococcal: 2011 Prevnar13: 2017 Shingles/Zostavax: 2011  Names of Other Physician/Practitioners you currently use: 1.  [facility] here for primary care 2. Jess Barters, eye doctor, last visit July 2016 Patient Care Team: Lucky Cowboy, MD as PCP - General (Internal Medicine)  SURGICAL HISTORY She  has a past surgical history that includes Shoulder arthroscopy (Left); Elbow arthrotomy (Left); Joint replacement (Right, 2010); Joint replacement (Left, 2012); Joint replacement (Right, 2013); thumb surg (Right); Toe Surgery (Bilateral); and Colectomy (2010). FAMILY HISTORY Her family history includes AAA (abdominal aortic aneurysm) in her sister; Cancer in her father and sister; Diabetes in her mother; Heart disease in her mother; Heart disease (age of onset: 12) in her brother; Hyperlipidemia in her mother. SOCIAL HISTORY She  reports that she has never smoked. She has never used smokeless tobacco. She reports that she drinks alcohol.   MEDICARE WELLNESS OBJECTIVES: Physical activity: Current Exercise Habits: Home exercise routine;Structured exercise class, Type of exercise: strength training/weights;walking, Time (Minutes): 20 Cardiac risk  factors: Cardiac Risk Factors include: advanced age (>81men, >76 women) Depression/mood screen:   Depression screen [facility] 2/9 06/17/2016  Decreased Interest 0  Down, Depressed, Hopeless 0  PHQ - 2 Score 0    ADLs:  In your present state of health, do you have any difficulty performing the following activities: 06/17/2016  Hearing? N  Vision? N  Difficulty concentrating or making decisions? N  Walking or climbing stairs? N  Dressing or bathing? N  Doing errands, shopping? N  Some recent data might be hidden     Cognitive Testing  Alert? Yes  Normal Appearance?Yes  Oriented to person? Yes  Place? Yes   Time? Yes  Recall of three objects?  Yes  Can perform simple calculations? Yes  Displays appropriate judgment?Yes  Can read the correct time from a watch face?Yes  EOL planning: Does Patient Have a Medical Advance Directive?: Yes Type of Advance Directive: Healthcare Power of Attorney, Living will Copy of Healthcare Power of Attorney in Chart?: No - copy requested  Review of Systems  Constitutional: Negative.   HENT: Negative.   Eyes: Negative.   Respiratory: Negative.   Cardiovascular: Negative.   Gastrointestinal: Negative.   Genitourinary: Negative.  Musculoskeletal: Negative.   Skin: Negative.      Objective:     Today's Vitals   06/17/16 1556  BP: 120/74  Pulse: 89  Resp: 14  Temp: 97.9 F (36.6 C)  SpO2: 98%  Weight: 149 lb 6.4 oz (67.8 kg)  Height:  (1.6 m)  PainSc: 0-No pain   Body mass index is 26.47 kg/m.  General appearance: alert, no distress, WD/WN, female HEENT: normocephalic, sclerae anicteric, TMs pearly, nares patent, no discharge or erythema, pharynx normal Oral cavity: MMM, no lesions Neck: supple, no lymphadenopathy, no thyromegaly, no masses Heart: RRR, normal S1, S2, no murmurs Lungs: CTA bilaterally, no wheezes, rhonchi, or rales Abdomen: +bs, soft, non tender, non distended, no masses, no hepatomegaly, no  splenomegaly Musculoskeletal: nontender, no swelling, no obvious deformity Extremities: no edema, no cyanosis, no clubbing Pulses: 2+ symmetric, upper and lower extremities, normal cap refill Neurological: alert, oriented x 3, CN2-12 intact, strength normal upper extremities and lower extremities, sensation normal throughout, DTRs 2+ throughout, no cerebellar signs, gait normal Psychiatric: normal affect, behavior normal, pleasant   Medicare Attestation I have personally reviewed: The patient's medical and social history Their use of alcohol, tobacco or illicit drugs Their current medications and supplements The patient's functional ability including ADLs,fall risks, home safety risks, cognitive, and hearing and visual impairment Diet and physical activities Evidence for depression or mood disorders  The patient's weight, height, BMI, and visual acuity have been recorded in the chart.  I have made referrals, counseling, and provided education to the patient based on review of the above and I have provided the patient with a written personalized care plan for preventive services.     Quentin Mulling, PA-C   06/17/2016

## 2016-06-17 ENCOUNTER — Ambulatory Visit (INDEPENDENT_AMBULATORY_CARE_PROVIDER_SITE_OTHER): Payer: Medicare Other | Admitting: Physician Assistant

## 2016-06-17 ENCOUNTER — Encounter: Payer: Self-pay | Admitting: Physician Assistant

## 2016-06-17 VITALS — BP 120/74 | HR 89 | Temp 97.9°F | Resp 14 | Ht 63.0 in | Wt 149.4 lb

## 2016-06-17 DIAGNOSIS — Z0001 Encounter for general adult medical examination with abnormal findings: Secondary | ICD-10-CM

## 2016-06-17 DIAGNOSIS — Z6826 Body mass index (BMI) 26.0-26.9, adult: Secondary | ICD-10-CM

## 2016-06-17 DIAGNOSIS — Z79899 Other long term (current) drug therapy: Secondary | ICD-10-CM

## 2016-06-17 DIAGNOSIS — R7309 Other abnormal glucose: Secondary | ICD-10-CM

## 2016-06-17 DIAGNOSIS — Z0000 Encounter for general adult medical examination without abnormal findings: Secondary | ICD-10-CM

## 2016-06-17 DIAGNOSIS — R6889 Other general symptoms and signs: Secondary | ICD-10-CM | POA: Diagnosis not present

## 2016-06-17 DIAGNOSIS — I872 Venous insufficiency (chronic) (peripheral): Secondary | ICD-10-CM

## 2016-06-17 DIAGNOSIS — E7800 Pure hypercholesterolemia, unspecified: Secondary | ICD-10-CM

## 2016-06-17 DIAGNOSIS — E559 Vitamin D deficiency, unspecified: Secondary | ICD-10-CM

## 2016-06-17 DIAGNOSIS — E2839 Other primary ovarian failure: Secondary | ICD-10-CM

## 2016-06-17 DIAGNOSIS — I10 Essential (primary) hypertension: Secondary | ICD-10-CM

## 2016-06-17 LAB — CBC WITH DIFFERENTIAL/PLATELET
BASOS ABS: 0 {cells}/uL (ref 0–200)
Basophils Relative: 0 %
EOS ABS: 62 {cells}/uL (ref 15–500)
EOS PCT: 1 %
HCT: 44.3 % (ref 35.0–45.0)
Hemoglobin: 14.4 g/dL (ref 11.7–15.5)
Lymphocytes Relative: 22 %
Lymphs Abs: 1364 cells/uL (ref 850–3900)
MCH: 31.1 pg (ref 27.0–33.0)
MCHC: 32.5 g/dL (ref 32.0–36.0)
MCV: 95.7 fL (ref 80.0–100.0)
MONOS PCT: 10 %
MPV: 10.1 fL (ref 7.5–12.5)
Monocytes Absolute: 620 cells/uL (ref 200–950)
NEUTROS ABS: 4154 {cells}/uL (ref 1500–7800)
NEUTROS PCT: 67 %
Platelets: 263 10*3/uL (ref 140–400)
RBC: 4.63 MIL/uL (ref 3.80–5.10)
RDW: 13.3 % (ref 11.0–15.0)
WBC: 6.2 10*3/uL (ref 3.8–10.8)

## 2016-06-17 LAB — TSH: TSH: 2.84 mIU/L

## 2016-06-17 MED ORDER — TRIAMCINOLONE ACETONIDE 0.5 % EX CREA: 1.0000 "application " | TOPICAL_CREAM | Freq: Two times a day (BID) | CUTANEOUS | 2 refills | Status: DC

## 2016-06-17 NOTE — Patient Instructions (Addendum)
Stasis Dermatitis Stasis dermatitis is a long-term (chronic) skin condition that happens when veins can no longer pump blood back to the heart (poor circulation). This condition causes a red or brown scaly rash or sores (ulcers) from the pooling of blood (stasis). This condition usually affects the lower legs. It may affect one leg or both legs. Without treatment, severe stasis dermatitis can lead to other skin conditions and infections. What are the causes? This condition is caused by poor circulation. What increases the risk? This condition is more likely to develop in people who:  Are not very active.  Stand for long periods of time.  Have veins that have become enlarged and twisted (varicose veins).  Have leg veins that are not strong enough to send blood back to the heart (venous insufficiency).  Have had a blood clot.  Have been pregnant many times.  Have had vein surgery.  Are obese.  Have heart or kidney failure.  Are 73 years of age or older. What are the signs or symptoms? Common early symptoms of this condition include:  Swelling in your ankle or leg. This might get better overnight but be worse again in the day.  Skin that looks thin on your ankle and leg.  Ow[place] Shark marks that develop slowly.  Skin that is easily irritated or cracked.  Red, swollen skin.  An achy or heavy feeling after you walk or stand for long periods of time.  Pain. Later and more severe symptoms of this condition include:  Skin that looks shiny.  Small, open sores (ulcers). These are often red or purple.  Dry, cracking skin.  Skin that feels hard.  Severe itching.  A change in the shape or color of your lower legs.  Severe pain.  Difficulty walking. How is this diagnosed? Your health care provider may suspect this condition from your symptoms and medical history. Your health care provider will also do a physical exam. You may need to see a health care provider who specializes  in skin diseases (dermatologist). You may also have tests to confirm the diagnosis, including:  Blood tests.  Imaging studies to check blood flow (Doppler ultrasound).  Allergy tests. How is this treated? Treatment for this condition may include medicine, such as:  Corticosteroid creams and ointments.  Non-corticosteroid medicines applied to the skin (topical).  Medicine to reduce swelling in the legs (diuretics).  Antibiotics.  Medicine to relieve itching (antihistamines). You may also have to wear:  Compression stockings or an elastic wrap to improve circulation.  A bandage (dressing).  A wrap that contains zinc and gelatin (Unna boot). Follow these instructions at home: [place] your skin as told by your health care provider. Do not use moisturizers with fragrance. This can irritate your skin.  Apply cool compresses to the affected areas.  Do not scratch your skin.  Do not rub your skin dry after a bath or shower. Gently pat your skin dry.  Do not use scented soaps, detergents, or perfumes. Medicines   Take or use over-the-counter and prescription medicines only as told by your health care provider.  If you were prescribed an antibiotic medicine, take or use it as told by your health care provider. Do not stop taking or using the antibiotic even if your condition starts to improve. Lifestyle   Do not stand or sit in one position for long periods of time.  Do not cross your legs when you sit.  Raise (elevate) your legs above the  level of your heart when you are sitting or lying down.  Walk as told by your health care provider. Walking increases blood flow.  Wear comfortable, loose-fitting clothing. Circulation in your legs will be worse if you wear tight pants, belts, and waistbands. General instructions   Change and remove any dressing as told by your health care provider, if this applies.  Wear compression stockings as told by your health  care provider, if this applies. These stockings help to prevent blood clots and reduce swelling in your legs.  Wear the [employer] as told by your health care provider, if this applies.  Keep all follow-up visits as told by your health care provider. This is important. Contact a health care provider if:  Your condition does not improve with treatment.  Your condition gets worse.  You have signs of infection in the affected area. Watch for:  Swelling.  Tenderness.  Redness.  Soreness.  Warmth.  You have a fever. Get help right away if:  You notice red streaks coming from the affected area.  Your bone or joint underneath the affected area becomes painful after the skin has healed.  The affected area turns darker.  You feel a deep pain in your leg or groin.  You are short of breath. This information is not intended to replace advice given to you by your health care provider. Make sure you discuss any questions you have with your health care provider. Document Released: 05/16/2005 Document Revised: 10/03/2015 Document Reviewed: 06/22/2014 Elsevier Interactive Patient Education  2017 [employer]  HOME CARE INSTRUCTIONS   Do not stand or sit in one position for long periods of time. Do not sit with your legs crossed. Rest with your legs raised during the day.  Your legs have to be higher than your heart so that gravity will force the valves to open, so please really elevate your legs.   Wear elastic stockings or support hose. Do not wear other tight, encircling garments around the legs, pelvis, or waist.  [employer]  has a wide variety of well priced compression stockings. [address] [place], [place] [address] [phone]  Walk as much as possible to increase blood flow.  Raise the foot of your bed at night with 2-inch blocks. SEEK MEDICAL CARE IF:   The skin around your ankle starts to break down.  You have pain, redness, tenderness, or hard swelling  developing in your leg over a vein.  You are uncomfortable due to leg pain. Document Released: 11/14/2004 Document Revised: 04/29/2011 Document Reviewed: 04/02/2010 [facility] Patient Information 2014 [place], [place].

## 2016-06-18 LAB — HEPATIC FUNCTION PANEL
ALT: 21 U/L (ref 6–29)
AST: 23 U/L (ref 10–35)
Albumin: 4.1 g/dL (ref 3.6–5.1)
Alkaline Phosphatase: 46 U/L (ref 33–130)
BILIRUBIN DIRECT: 0.1 mg/dL (ref <=0.2)
BILIRUBIN INDIRECT: 0.2 mg/dL (ref 0.2–1.2)
TOTAL PROTEIN: 6.7 g/dL (ref 6.1–8.1)
Total Bilirubin: 0.3 mg/dL (ref 0.2–1.2)

## 2016-06-18 LAB — BASIC METABOLIC PANEL WITH GFR
BUN: 13 mg/dL (ref 7–25)
CALCIUM: 10.1 mg/dL (ref 8.6–10.4)
CO2: 26 mmol/L (ref 20–31)
Chloride: 104 mmol/L (ref 98–110)
Creat: 0.79 mg/dL (ref 0.60–0.93)
GFR, EST AFRICAN AMERICAN: 86 mL/min (ref >=60)
GFR, EST NON AFRICAN AMERICAN: 75 mL/min (ref >=60)
GLUCOSE: 83 mg/dL (ref 65–99)
Potassium: 4.2 mmol/L (ref 3.5–5.3)
SODIUM: 143 mmol/L (ref 135–146)

## 2016-06-18 LAB — LIPID PANEL
CHOL/HDL RATIO: 3.3 ratio (ref <5.0)
CHOLESTEROL: 199 mg/dL (ref <200)
HDL: 60 mg/dL (ref >50)
LDL CALC: 124 mg/dL — AB (ref <100)
TRIGLYCERIDES: 77 mg/dL (ref <150)
VLDL: 15 mg/dL (ref <30)

## 2016-06-18 LAB — MAGNESIUM: Magnesium: 2.1 mg/dL (ref 1.5–2.5)

## 2016-06-18 NOTE — Progress Notes (Signed)
Pt aware of lab results & voiced understanding of those results.

## 2016-07-02 ENCOUNTER — Ambulatory Visit
Admission: RE | Admit: 2016-07-02 | Discharge: 2016-07-02 | Disposition: A | Discharge status: Home / Self Care | Payer: Medicare Other | Source: Ambulatory Visit | Attending: Physician Assistant | Admitting: Physician Assistant

## 2016-07-02 DIAGNOSIS — E2839 Other primary ovarian failure: Secondary | ICD-10-CM

## 2016-07-02 DIAGNOSIS — M85851 Other specified disorders of bone density and structure, right thigh: Secondary | ICD-10-CM | POA: Diagnosis not present

## 2016-07-02 DIAGNOSIS — Z780 Asymptomatic menopausal state: Secondary | ICD-10-CM | POA: Diagnosis not present

## 2016-07-03 NOTE — Progress Notes (Signed)
Pt aware of lab results & voiced understanding of those results.

## 2016-09-30 DIAGNOSIS — H2513 Age-related nuclear cataract, bilateral: Secondary | ICD-10-CM | POA: Diagnosis not present

## 2016-09-30 DIAGNOSIS — H43393 Other vitreous opacities, bilateral: Secondary | ICD-10-CM | POA: Diagnosis not present

## 2016-09-30 DIAGNOSIS — H527 Unspecified disorder of refraction: Secondary | ICD-10-CM | POA: Diagnosis not present

## 2017-04-16 DIAGNOSIS — M25551 Pain in right hip: Secondary | ICD-10-CM | POA: Diagnosis not present

## 2017-04-16 DIAGNOSIS — M25561 Pain in right knee: Secondary | ICD-10-CM | POA: Diagnosis not present

## 2017-05-09 DIAGNOSIS — M25551 Pain in right hip: Secondary | ICD-10-CM | POA: Diagnosis not present

## 2017-05-15 DIAGNOSIS — M25551 Pain in right hip: Secondary | ICD-10-CM | POA: Diagnosis not present

## 2017-05-22 DIAGNOSIS — M25551 Pain in right hip: Secondary | ICD-10-CM | POA: Diagnosis not present

## 2017-05-29 DIAGNOSIS — M25551 Pain in right hip: Secondary | ICD-10-CM | POA: Diagnosis not present

## 2017-06-16 NOTE — Progress Notes (Signed)
MEDICARE ANNUAL WELLNESS VISIT AND CPE  Assessment:    Encounter for Medicare annual wellness exam 1 year Declines MGM  Hypertension, unspecified type - continue medications, DASH diet, exercise and monitor at home. Call if greater than 130/80.  -     CBC with Differential/Platelet -     COMPLETE METABOLIC PANEL WITH GFR -     TSH -     Urinalysis, Routine w reflex microscopic -     Microalbumin / creatinine urine ratio -     EKG 12-Lead  Elevated cholesterol -continue flax seed/fiber, check lipids, decrease fatty foods, increase activity.  -     Lipid panel  Vitamin D deficiency Continue supplement  BMI 26.0-26.9,adult  Overweight  - long discussion about weight loss, diet, and exercise -recommended diet heavy in fruits and veggies and low in animal meats, cheeses, and dairy products  Osteopenia, unspecified location Continue vitamin D/weight bearing exercise  Medication management  Venous stasis dermatitis of right lower extremity Varicose veins - weight loss discussed, continue compression stockings and elevation  Screen for colon cancer -     Cologuard  1 year declines 6 month OV Over 40 minutes of exam, counseling, chart review and critical decision making was performed Future Appointments  Date Time Provider Department Center  06/24/2018  3:00 PM Quentin Mulling, PA-C [id number] None     Plan:   During the course of the visit the patient was educated and counseled about appropriate screening and preventive services including:    Pneumococcal vaccine   Prevnar 13  Influenza vaccine  Td vaccine  Screening electrocardiogram  Bone densitometry screening  Colorectal cancer screening  Diabetes screening  Glaucoma screening  Nutrition counseling   Advanced directives: requested   Subjective:  Heather Barrera is a 72 y.o. female who presents for Medicare Annual Wellness Visit and complete physical.    She is in PT for right hip bursitis.    Her blood pressure has been controlled at home, today their BP is BP: 122/76  She does workout. She denies chest pain, shortness of breath, dizziness.  She is not on cholesterol medication and denies myalgias. Her cholesterol is at goal. The cholesterol last visit was:   Lab Results  Component Value Date   CHOL 199 06/17/2016   HDL 60 06/17/2016   LDLCALC 124 (H) 06/17/2016   TRIG 77 06/17/2016   CHOLHDL 3.3 06/17/2016    She has been working on diet and exercise for prediabetes, and denies paresthesia of the feet, polydipsia, polyuria and visual disturbances. Last A1C in the office was:  Lab Results  Component Value Date   HGBA1C 5.2 10/05/2015   Last GFR:  Lab Results  Component Value Date   GFRNONAA 75 06/17/2016   Patient is on Vitamin D supplement, on 15,000  Lab Results  Component Value Date   VD25OH 44 12/15/2014     BMI is Body mass index is 24.2 kg/m., she is working on diet and exercise. She is logging her food and trying to lose weight.  Wt Readings from Last 3 Encounters:  06/18/17 136 lb 9.6 oz (62 kg)  06/17/16 149 lb 6.4 oz (67.8 kg)  10/05/15 149 lb 12.8 oz (67.9 kg)    Medication Review: Current Outpatient Medications on File Prior to Visit  Medication Sig Dispense Refill  . aspirin 325 MG EC tablet Take 325 mg by mouth daily.    . Cholecalciferol (VITAMIN D3) 10000 UNITS capsule Take 1 capsule (10,000 Units total) by  mouth daily.    . Ferrous Fumarate (IRON) 18 MG TBCR Take 1 tablet by mouth daily.    Marland Kitchen GARLIC PO Take 1 capsule by mouth daily.    . Multiple Vitamins-Minerals (MULTIVITAMIN PO) Take 1 tablet by mouth daily.    . naproxen sodium (ANAPROX) 220 MG tablet Take 220 mg by mouth 2 (two) times daily with a meal. PRN    . OVER THE COUNTER MEDICATION CoQ10 200 mg daily    . OVER THE COUNTER MEDICATION Z Quil 1/2 ounce at hs    . OVER THE COUNTER MEDICATION Calcium-magnesium-zinc 1 tablet daily.    Marland Kitchen triamcinolone cream (KENALOG) 0.5 % Apply 1  application topically 2 (two) times daily. 80 g 2  . vitamin E 400 UNIT capsule Take 400 Units by mouth daily.     No current facility-administered medications on file prior to visit.     Allergies  Allergen Reactions  . Penicillins Anaphylaxis  . Demerol [Meperidine] Nausea And Vomiting  . Betadine [Povidone Iodine] Rash    Current Problems (verified) Patient Active Problem List   Diagnosis Date Noted  . Hypertension 12/24/2014  . Elevated cholesterol 12/24/2014  . Other abnormal glucose 12/24/2014  . Vitamin D deficiency 12/24/2014  . BMI 26.0-26.9,adult 12/24/2014    Screening Tests Immunization History  Administered Date(s) Administered  . Influenza, High Dose Seasonal PF 12/15/2014, 10/05/2015  . Pneumococcal Conjugate-13 10/05/2015  . Pneumococcal Polysaccharide-23 02/18/2009  . Zoster 02/18/2009    Preventative care: Last colonoscopy: 2010, had ruptured colon and had colectomy- would like a cologuard Last mammogram: 5-6 years declines  Last pap smear/pelvic exam: remote   DEXA: osteopenia 06/2016 -2.1  Prior vaccinations: TD or Tdap: declines  Influenza: 2018 Pneumococcal: 2011 Prevnar13: 2017 Shingles/Zostavax: 2011  Names of Other Physician/Practitioners you currently use: 1. [facility] here for primary care 2. Jess Barters, eye doctor, last visit July 2018 3. Dr. Baruch Gouty, May 2019 Patient Care Team: Lucky Cowboy, MD as PCP - General (Internal Medicine)  SURGICAL HISTORY She  has a past surgical history that includes Shoulder arthroscopy (Left); Elbow arthrotomy (Left); Joint replacement (Right, 2010); Joint replacement (Left, 2012); Joint replacement (Right, 2013); thumb surg (Right); Toe Surgery (Bilateral); and Colectomy (2010). FAMILY HISTORY Her family history includes AAA (abdominal aortic aneurysm) in her sister; Cancer in her father and sister; Diabetes in her mother; Heart disease in her mother; Heart disease  (age of onset: 74) in her brother; Hyperlipidemia in her mother. SOCIAL HISTORY She  reports that she has never smoked. She has never used smokeless tobacco. She reports that she drinks alcohol.   MEDICARE WELLNESS OBJECTIVES: Physical activity: Current Exercise Habits: Home exercise routine, Type of exercise: walking, Time (Minutes): 30, Frequency (Times/Week): 4, Weekly Exercise (Minutes/Week): 120, Intensity: Mild Cardiac risk factors: Cardiac Risk Factors include: advanced age (>18men, >21 women) Depression/mood screen:   Depression screen [facility] 2/9 06/18/2017  Decreased Interest 0  Down, Depressed, Hopeless 0  PHQ - 2 Score 0    ADLs:  In your present state of health, do you have any difficulty performing the following activities: 06/18/2017  Hearing? N  Vision? N  Difficulty concentrating or making decisions? N  Walking or climbing stairs? N  Dressing or bathing? N  Doing errands, shopping? N  Some recent data might be hidden     Cognitive Testing  Alert? Yes  Normal Appearance?Yes  Oriented to person? Yes  Place? Yes   Time? Yes  Recall of three  objects?  Yes  Can perform simple calculations? Yes  Displays appropriate judgment?Yes  Can read the correct time from a watch face?Yes  EOL planning: Does Patient Have a Medical Advance Directive?: Yes Type of Advance Directive: Healthcare Power of Attorney, Living will Copy of Healthcare Power of Attorney in Chart?: No - copy requested  Review of Systems  Constitutional: Negative.   HENT: Negative.   Eyes: Negative.   Respiratory: Negative.   Cardiovascular: Negative.   Gastrointestinal: Negative.   Genitourinary: Negative.   Musculoskeletal: Negative.   Skin: Negative.      Objective:     Today's Vitals   06/18/17 1437  BP: 122/76  Pulse: 77  Resp: 14  Temp: (!) 97.3 F (36.3 C)  SpO2: 99%  Weight: 136 lb 9.6 oz (62 kg)  PainSc: 0-No pain   Body mass index is 24.2 kg/m.  General appearance: alert, no  distress, WD/WN, female HEENT: normocephalic, sclerae anicteric, TMs pearly, nares patent, no discharge or erythema, pharynx normal Oral cavity: MMM, no lesions Neck: supple, no lymphadenopathy, no thyromegaly, no masses Heart: RRR, normal S1, S2, no murmurs Lungs: CTA bilaterally, no wheezes, rhonchi, or rales Abdomen: +bs, soft, non tender, non distended, no masses, no hepatomegaly, no splenomegaly Musculoskeletal: nontender, no swelling, no obvious deformity Extremities: no edema, no cyanosis, no clubbing Pulses: 2+ symmetric, upper and lower extremities, normal cap refill Neurological: alert, oriented x 3, CN2-12 intact, strength normal upper extremities and lower extremities, sensation normal throughout, DTRs 2+ throughout, no cerebellar signs, gait normal Psychiatric: normal affect, behavior normal, pleasant   Medicare Attestation I have personally reviewed: The patient's medical and social history Their use of alcohol, tobacco or illicit drugs Their current medications and supplements The patient's functional ability including ADLs,fall risks, home safety risks, cognitive, and hearing and visual impairment Diet and physical activities Evidence for depression or mood disorders  The patient's weight, height, BMI, and visual acuity have been recorded in the chart.  I have made referrals, counseling, and provided education to the patient based on review of the above and I have provided the patient with a written personalized care plan for preventive services.     Quentin Mulling, PA-C   06/18/2017

## 2017-06-18 ENCOUNTER — Ambulatory Visit (INDEPENDENT_AMBULATORY_CARE_PROVIDER_SITE_OTHER): Payer: Medicare Other | Admitting: Physician Assistant

## 2017-06-18 ENCOUNTER — Encounter: Payer: Self-pay | Admitting: Physician Assistant

## 2017-06-18 VITALS — BP 122/76 | HR 77 | Temp 97.3°F | Resp 14 | Ht 63.0 in | Wt 136.6 lb

## 2017-06-18 DIAGNOSIS — Z136 Encounter for screening for cardiovascular disorders: Secondary | ICD-10-CM | POA: Diagnosis not present

## 2017-06-18 DIAGNOSIS — Z0001 Encounter for general adult medical examination with abnormal findings: Secondary | ICD-10-CM

## 2017-06-18 DIAGNOSIS — R6889 Other general symptoms and signs: Secondary | ICD-10-CM

## 2017-06-18 DIAGNOSIS — I872 Venous insufficiency (chronic) (peripheral): Secondary | ICD-10-CM

## 2017-06-18 DIAGNOSIS — E559 Vitamin D deficiency, unspecified: Secondary | ICD-10-CM

## 2017-06-18 DIAGNOSIS — I10 Essential (primary) hypertension: Secondary | ICD-10-CM

## 2017-06-18 DIAGNOSIS — E7800 Pure hypercholesterolemia, unspecified: Secondary | ICD-10-CM

## 2017-06-18 DIAGNOSIS — Z0000 Encounter for general adult medical examination without abnormal findings: Secondary | ICD-10-CM

## 2017-06-18 DIAGNOSIS — Z1211 Encounter for screening for malignant neoplasm of colon: Secondary | ICD-10-CM

## 2017-06-18 DIAGNOSIS — M8580 Other specified disorders of bone density and structure, unspecified site: Secondary | ICD-10-CM

## 2017-06-18 DIAGNOSIS — Z6826 Body mass index (BMI) 26.0-26.9, adult: Secondary | ICD-10-CM

## 2017-06-18 DIAGNOSIS — Z79899 Other long term (current) drug therapy: Secondary | ICD-10-CM

## 2017-06-18 LAB — CBC WITH DIFFERENTIAL/PLATELET
BASOS ABS: 37 {cells}/uL (ref 0–200)
Basophils Relative: 0.6 %
EOS ABS: 99 {cells}/uL (ref 15–500)
Eosinophils Relative: 1.6 %
HEMATOCRIT: 37.4 % (ref 35.0–45.0)
HEMOGLOBIN: 12.3 g/dL (ref 11.7–15.5)
LYMPHS ABS: 1097 {cells}/uL (ref 850–3900)
MCH: 27.7 pg (ref 27.0–33.0)
MCHC: 32.9 g/dL (ref 32.0–36.0)
MCV: 84.2 fL (ref 80.0–100.0)
MPV: 10.3 fL (ref 7.5–12.5)
Monocytes Relative: 8.4 %
NEUTROS ABS: 4445 {cells}/uL (ref 1500–7800)
NEUTROS PCT: 71.7 %
Platelets: 410 10*3/uL — ABNORMAL HIGH (ref 140–400)
RBC: 4.44 10*6/uL (ref 3.80–5.10)
RDW: 15 % (ref 11.0–15.0)
Total Lymphocyte: 17.7 %
WBC: 6.2 10*3/uL (ref 3.8–10.8)
WBCMIX: 521 {cells}/uL (ref 200–950)

## 2017-06-18 LAB — TSH: TSH: 2.58 mIU/L (ref 0.40–4.50)

## 2017-06-18 NOTE — Patient Instructions (Addendum)
[facility]  7 a.m.-6:30 p.m., Monday 7 a.m.-5 p.m., Tuesday-Friday Schedule an appointment by calling [phone].  Tumeric with black pepper extract is a great natural antiinflammatory that helps with arthritis and aches and pain. Can get from [employer] or any health food store. Need to take at least 841m twice a day with food.    Cologuard is an easy to use noninvasive colon cancer screening test based on the latest advances in stool DNA science.   Colon cancer is 3rd most diagnosed cancer and 2nd leading cause of death in both men and women 566years of age and older despite being one of the most preventable and treatable cancers if found early.  4 of out 5 people diagnosed with colon cancer have NO prior family history.  When caught EARLY 90% of colon cancer is curable.   You will receive a short call from [facility] at [employer] when you receive a call they will say they are from [employer]  to confirm your mailing address and give you more information.  When they calll you, it will appear on the caller ID as "[employer]" or in some cases only this number will appear, [phone]   [employer]will ship your collection kit directly to you. You will collect a single stool sample in the privacy of your own home, no special preparation required. You will return the kit via [employer]-paid shipping or pick-up, in the same box it arrived in. Then I will contact you to discuss your results after I receive them from the laboratory.   If you have any questions or concerns, Cologuard Customer Support Specialist are available 24 hours a day, 7 days a week at [phone] go to [email or web address]   VENOUS INSUFFICIENCY Our lower leg venous system is not the most reliable, the heart does NOT pump fluid up, there is a valve system.  The muscles of the leg squeeze and the blood moves up and a valve  opens and close, then they squeeze, blood moves up and valves open and closes keeping the blood moving towards the heart.  Lots can go wrong with this valve system.  If someone is sitting or standing without movement, everyone will get swelling.  THINGS TO DO:  Do not stand or sit in one position for long periods of time. Do not sit with your legs crossed. Rest with your legs raised during the day.  Your legs have to be higher than your heart so that gravity will force the valves to open, so please really elevate your legs.   Wear elastic stockings or support hose. Do not wear other tight, encircling garments around the legs, pelvis, or waist.  [employer]  has a wide variety of well priced compression stockings. [id number] a good cheap selection, I like the socks, they are not as hard to get on  Walk as much as possible to increase blood flow.  Raise the foot of your bed at night with 2-inch blocks.  SEEK MEDICAL CARE IF:   The skin around your ankle starts to break down.  You have pain, redness, tenderness, or hard swelling developing in your leg over a vein.  You are uncomfortable due to leg pain.  If you ever have shortness of breath with exertion or chest pain go to the ER.

## 2017-06-19 DIAGNOSIS — M25551 Pain in right hip: Secondary | ICD-10-CM | POA: Diagnosis not present

## 2017-06-19 DIAGNOSIS — R6889 Other general symptoms and signs: Secondary | ICD-10-CM | POA: Diagnosis not present

## 2017-06-19 LAB — MICROALBUMIN / CREATININE URINE RATIO
CREATININE, URINE: 51 mg/dL (ref 20–275)
MICROALB UR: 0.5 mg/dL
Microalb Creat Ratio: 10 mcg/mg creat (ref <30)

## 2017-06-19 LAB — COMPLETE METABOLIC PANEL WITH GFR
AG Ratio: 1.6 (calc) (ref 1.0–2.5)
ALT: 18 U/L (ref 6–29)
AST: 22 U/L (ref 10–35)
Albumin: 3.9 g/dL (ref 3.6–5.1)
Alkaline phosphatase (APISO): 48 U/L (ref 33–130)
BILIRUBIN TOTAL: 0.3 mg/dL (ref 0.2–1.2)
BUN: 7 mg/dL (ref 7–25)
CO2: 28 mmol/L (ref 20–32)
Calcium: 9.5 mg/dL (ref 8.6–10.4)
Chloride: 105 mmol/L (ref 98–110)
Creat: 0.67 mg/dL (ref 0.60–0.93)
GFR, EST AFRICAN AMERICAN: 101 mL/min/{1.73_m2} (ref >=60)
GFR, EST NON AFRICAN AMERICAN: 87 mL/min/{1.73_m2} (ref >=60)
GLUCOSE: 88 mg/dL (ref 65–99)
Globulin: 2.5 g/dL (calc) (ref 1.9–3.7)
Potassium: 4.2 mmol/L (ref 3.5–5.3)
Sodium: 142 mmol/L (ref 135–146)
TOTAL PROTEIN: 6.4 g/dL (ref 6.1–8.1)

## 2017-06-19 LAB — LIPID PANEL
CHOL/HDL RATIO: 3.2 (calc) (ref <5.0)
Cholesterol: 219 mg/dL — ABNORMAL HIGH (ref <200)
HDL: 68 mg/dL (ref >50)
LDL CHOLESTEROL (CALC): 136 mg/dL — AB
NON-HDL CHOLESTEROL (CALC): 151 mg/dL — AB (ref <130)
TRIGLYCERIDES: 59 mg/dL (ref <150)

## 2017-06-19 LAB — URINALYSIS, ROUTINE W REFLEX MICROSCOPIC
BILIRUBIN URINE: NEGATIVE
Glucose, UA: NEGATIVE
HGB URINE DIPSTICK: NEGATIVE
KETONES UR: NEGATIVE
Leukocytes, UA: NEGATIVE
NITRITE: NEGATIVE
PROTEIN: NEGATIVE
Specific Gravity, Urine: 1.012 (ref 1.001–1.03)
pH: 6 (ref 5.0–8.0)

## 2017-06-26 DIAGNOSIS — R6889 Other general symptoms and signs: Secondary | ICD-10-CM | POA: Diagnosis not present

## 2017-06-26 DIAGNOSIS — M25551 Pain in right hip: Secondary | ICD-10-CM | POA: Diagnosis not present

## 2017-09-08 DIAGNOSIS — M25551 Pain in right hip: Secondary | ICD-10-CM | POA: Diagnosis not present

## 2017-09-29 DIAGNOSIS — M25551 Pain in right hip: Secondary | ICD-10-CM | POA: Diagnosis not present

## 2017-12-10 DIAGNOSIS — M25551 Pain in right hip: Secondary | ICD-10-CM | POA: Diagnosis not present

## 2017-12-10 DIAGNOSIS — M25561 Pain in right knee: Secondary | ICD-10-CM | POA: Diagnosis not present

## 2017-12-16 DIAGNOSIS — Z96653 Presence of artificial knee joint, bilateral: Secondary | ICD-10-CM | POA: Diagnosis not present

## 2017-12-16 DIAGNOSIS — M25551 Pain in right hip: Secondary | ICD-10-CM | POA: Diagnosis not present

## 2017-12-16 DIAGNOSIS — Z96641 Presence of right artificial hip joint: Secondary | ICD-10-CM | POA: Diagnosis not present

## 2017-12-16 DIAGNOSIS — M6281 Muscle weakness (generalized): Secondary | ICD-10-CM | POA: Diagnosis not present

## 2017-12-29 DIAGNOSIS — Z23 Encounter for immunization: Secondary | ICD-10-CM | POA: Diagnosis not present

## 2018-01-08 DIAGNOSIS — M25551 Pain in right hip: Secondary | ICD-10-CM | POA: Diagnosis not present

## 2018-01-12 DIAGNOSIS — M25551 Pain in right hip: Secondary | ICD-10-CM | POA: Diagnosis not present

## 2018-01-12 DIAGNOSIS — M25451 Effusion, right hip: Secondary | ICD-10-CM | POA: Diagnosis not present

## 2018-01-12 DIAGNOSIS — Z471 Aftercare following joint replacement surgery: Secondary | ICD-10-CM | POA: Diagnosis not present

## 2018-01-12 DIAGNOSIS — Z96641 Presence of right artificial hip joint: Secondary | ICD-10-CM | POA: Diagnosis not present

## 2018-01-22 DIAGNOSIS — M25551 Pain in right hip: Secondary | ICD-10-CM | POA: Diagnosis not present

## 2018-01-26 DIAGNOSIS — M47896 Other spondylosis, lumbar region: Secondary | ICD-10-CM | POA: Diagnosis not present

## 2018-01-26 DIAGNOSIS — M25551 Pain in right hip: Secondary | ICD-10-CM | POA: Diagnosis not present

## 2018-01-26 DIAGNOSIS — M4316 Spondylolisthesis, lumbar region: Secondary | ICD-10-CM | POA: Diagnosis not present

## 2018-01-26 DIAGNOSIS — M2548 Effusion, other site: Secondary | ICD-10-CM | POA: Diagnosis not present

## 2018-01-26 DIAGNOSIS — M48061 Spinal stenosis, lumbar region without neurogenic claudication: Secondary | ICD-10-CM | POA: Diagnosis not present

## 2018-02-17 DIAGNOSIS — M25551 Pain in right hip: Secondary | ICD-10-CM | POA: Diagnosis not present

## 2018-02-24 DIAGNOSIS — M25551 Pain in right hip: Secondary | ICD-10-CM | POA: Diagnosis not present

## 2018-02-24 DIAGNOSIS — Z96641 Presence of right artificial hip joint: Secondary | ICD-10-CM | POA: Diagnosis not present

## 2018-02-24 DIAGNOSIS — M76891 Other specified enthesopathies of right lower limb, excluding foot: Secondary | ICD-10-CM | POA: Diagnosis not present

## 2018-02-27 DIAGNOSIS — M25551 Pain in right hip: Secondary | ICD-10-CM | POA: Diagnosis not present

## 2018-03-05 DIAGNOSIS — Z87892 Personal history of anaphylaxis: Secondary | ICD-10-CM | POA: Diagnosis not present

## 2018-03-05 DIAGNOSIS — E785 Hyperlipidemia, unspecified: Secondary | ICD-10-CM | POA: Diagnosis not present

## 2018-03-05 DIAGNOSIS — Z882 Allergy status to sulfonamides status: Secondary | ICD-10-CM | POA: Diagnosis not present

## 2018-03-05 DIAGNOSIS — Z7982 Long term (current) use of aspirin: Secondary | ICD-10-CM | POA: Diagnosis not present

## 2018-03-05 DIAGNOSIS — Z880 Allergy status to penicillin: Secondary | ICD-10-CM | POA: Diagnosis not present

## 2018-03-05 DIAGNOSIS — Z01810 Encounter for preprocedural cardiovascular examination: Secondary | ICD-10-CM | POA: Diagnosis not present

## 2018-03-05 DIAGNOSIS — I872 Venous insufficiency (chronic) (peripheral): Secondary | ICD-10-CM | POA: Diagnosis not present

## 2018-03-05 DIAGNOSIS — Z96653 Presence of artificial knee joint, bilateral: Secondary | ICD-10-CM | POA: Diagnosis not present

## 2018-03-05 DIAGNOSIS — Z888 Allergy status to other drugs, medicaments and biological substances status: Secondary | ICD-10-CM | POA: Diagnosis not present

## 2018-03-05 DIAGNOSIS — T84010S Broken internal right hip prosthesis, sequela: Secondary | ICD-10-CM | POA: Diagnosis not present

## 2018-03-05 DIAGNOSIS — M8580 Other specified disorders of bone density and structure, unspecified site: Secondary | ICD-10-CM | POA: Diagnosis not present

## 2018-03-05 DIAGNOSIS — M25551 Pain in right hip: Secondary | ICD-10-CM | POA: Diagnosis not present

## 2018-03-05 DIAGNOSIS — Z01818 Encounter for other preprocedural examination: Secondary | ICD-10-CM | POA: Diagnosis not present

## 2018-03-05 DIAGNOSIS — I491 Atrial premature depolarization: Secondary | ICD-10-CM | POA: Diagnosis not present

## 2018-03-05 DIAGNOSIS — I10 Essential (primary) hypertension: Secondary | ICD-10-CM | POA: Diagnosis not present

## 2018-03-05 DIAGNOSIS — Z01812 Encounter for preprocedural laboratory examination: Secondary | ICD-10-CM | POA: Diagnosis not present

## 2018-03-12 DIAGNOSIS — M8580 Other specified disorders of bone density and structure, unspecified site: Secondary | ICD-10-CM | POA: Diagnosis present

## 2018-03-12 DIAGNOSIS — M1611 Unilateral primary osteoarthritis, right hip: Secondary | ICD-10-CM | POA: Diagnosis not present

## 2018-03-12 DIAGNOSIS — I878 Other specified disorders of veins: Secondary | ICD-10-CM | POA: Diagnosis present

## 2018-03-12 DIAGNOSIS — I10 Essential (primary) hypertension: Secondary | ICD-10-CM | POA: Diagnosis present

## 2018-03-12 DIAGNOSIS — M25551 Pain in right hip: Secondary | ICD-10-CM | POA: Diagnosis not present

## 2018-03-12 DIAGNOSIS — Z743 Need for continuous supervision: Secondary | ICD-10-CM | POA: Diagnosis not present

## 2018-03-12 DIAGNOSIS — Z880 Allergy status to penicillin: Secondary | ICD-10-CM | POA: Diagnosis not present

## 2018-03-12 DIAGNOSIS — R279 Unspecified lack of coordination: Secondary | ICD-10-CM | POA: Diagnosis not present

## 2018-03-12 DIAGNOSIS — T8484XA Pain due to internal orthopedic prosthetic devices, implants and grafts, initial encounter: Secondary | ICD-10-CM | POA: Diagnosis not present

## 2018-03-12 DIAGNOSIS — Z885 Allergy status to narcotic agent status: Secondary | ICD-10-CM | POA: Diagnosis not present

## 2018-03-12 DIAGNOSIS — R5381 Other malaise: Secondary | ICD-10-CM | POA: Diagnosis not present

## 2018-03-12 DIAGNOSIS — Z882 Allergy status to sulfonamides status: Secondary | ICD-10-CM | POA: Diagnosis not present

## 2018-03-12 DIAGNOSIS — E785 Hyperlipidemia, unspecified: Secondary | ICD-10-CM | POA: Diagnosis present

## 2018-03-12 DIAGNOSIS — T84020A Dislocation of internal right hip prosthesis, initial encounter: Secondary | ICD-10-CM | POA: Diagnosis not present

## 2018-03-12 DIAGNOSIS — R262 Difficulty in walking, not elsewhere classified: Secondary | ICD-10-CM | POA: Diagnosis not present

## 2018-03-12 DIAGNOSIS — Z888 Allergy status to other drugs, medicaments and biological substances status: Secondary | ICD-10-CM | POA: Diagnosis not present

## 2018-03-12 DIAGNOSIS — Z471 Aftercare following joint replacement surgery: Secondary | ICD-10-CM | POA: Diagnosis not present

## 2018-03-12 DIAGNOSIS — Z7982 Long term (current) use of aspirin: Secondary | ICD-10-CM | POA: Diagnosis not present

## 2018-03-12 DIAGNOSIS — D649 Anemia, unspecified: Secondary | ICD-10-CM | POA: Diagnosis not present

## 2018-03-12 DIAGNOSIS — T84090A Other mechanical complication of internal right hip prosthesis, initial encounter: Secondary | ICD-10-CM | POA: Diagnosis not present

## 2018-03-12 DIAGNOSIS — Z96641 Presence of right artificial hip joint: Secondary | ICD-10-CM | POA: Diagnosis not present

## 2018-03-12 DIAGNOSIS — R29898 Other symptoms and signs involving the musculoskeletal system: Secondary | ICD-10-CM | POA: Diagnosis not present

## 2018-03-12 DIAGNOSIS — M6281 Muscle weakness (generalized): Secondary | ICD-10-CM | POA: Diagnosis not present

## 2018-03-12 DIAGNOSIS — R278 Other lack of coordination: Secondary | ICD-10-CM | POA: Diagnosis not present

## 2018-03-16 DIAGNOSIS — Z7189 Other specified counseling: Secondary | ICD-10-CM | POA: Diagnosis not present

## 2018-03-16 DIAGNOSIS — M1611 Unilateral primary osteoarthritis, right hip: Secondary | ICD-10-CM | POA: Diagnosis not present

## 2018-03-16 DIAGNOSIS — D649 Anemia, unspecified: Secondary | ICD-10-CM | POA: Diagnosis not present

## 2018-03-16 DIAGNOSIS — R278 Other lack of coordination: Secondary | ICD-10-CM | POA: Diagnosis not present

## 2018-03-16 DIAGNOSIS — Z471 Aftercare following joint replacement surgery: Secondary | ICD-10-CM | POA: Diagnosis not present

## 2018-03-16 DIAGNOSIS — Z96641 Presence of right artificial hip joint: Secondary | ICD-10-CM | POA: Diagnosis not present

## 2018-03-16 DIAGNOSIS — R279 Unspecified lack of coordination: Secondary | ICD-10-CM | POA: Diagnosis not present

## 2018-03-16 DIAGNOSIS — R29898 Other symptoms and signs involving the musculoskeletal system: Secondary | ICD-10-CM | POA: Diagnosis not present

## 2018-03-16 DIAGNOSIS — M6281 Muscle weakness (generalized): Secondary | ICD-10-CM | POA: Diagnosis not present

## 2018-03-16 DIAGNOSIS — R262 Difficulty in walking, not elsewhere classified: Secondary | ICD-10-CM | POA: Diagnosis not present

## 2018-03-16 DIAGNOSIS — R5381 Other malaise: Secondary | ICD-10-CM | POA: Diagnosis not present

## 2018-03-16 DIAGNOSIS — D6489 Other specified anemias: Secondary | ICD-10-CM | POA: Diagnosis not present

## 2018-03-16 DIAGNOSIS — M25551 Pain in right hip: Secondary | ICD-10-CM | POA: Diagnosis not present

## 2018-03-16 DIAGNOSIS — Z743 Need for continuous supervision: Secondary | ICD-10-CM | POA: Diagnosis not present

## 2018-03-17 DIAGNOSIS — Z7189 Other specified counseling: Secondary | ICD-10-CM | POA: Diagnosis not present

## 2018-03-17 DIAGNOSIS — M1611 Unilateral primary osteoarthritis, right hip: Secondary | ICD-10-CM | POA: Diagnosis not present

## 2018-03-17 MED ORDER — ACETAMINOPHEN 500 MG PO TABS
1000.00 | ORAL_TABLET | ORAL | Status: DC
Start: 2018-03-16 — End: 2018-03-17

## 2018-03-17 MED ORDER — MORPHINE SULFATE (PF) 4 MG/ML IV SOLN
4.00 | INTRAVENOUS | Status: DC
Start: ? — End: 2018-03-17

## 2018-03-17 MED ORDER — OXYCODONE HCL 5 MG PO TABS
5.00 | ORAL_TABLET | ORAL | Status: DC
Start: ? — End: 2018-03-17

## 2018-03-17 MED ORDER — PROMETHAZINE HCL 25 MG/ML IJ SOLN
12.50 | INTRAMUSCULAR | Status: DC
Start: ? — End: 2018-03-17

## 2018-03-17 MED ORDER — ALUM & MAG HYDROXIDE-SIMETH 200-200-20 MG/5ML PO SUSP
30.00 | ORAL | Status: DC
Start: ? — End: 2018-03-17

## 2018-03-17 MED ORDER — GENERIC EXTERNAL MEDICATION
0.04 | Status: DC
Start: ? — End: 2018-03-17

## 2018-03-17 MED ORDER — TRAMADOL HCL 50 MG PO TABS
50.00 | ORAL_TABLET | ORAL | Status: DC
Start: 2018-03-16 — End: 2018-03-17

## 2018-03-17 MED ORDER — DIPHENHYDRAMINE HCL 25 MG PO CAPS
25.00 | ORAL_CAPSULE | ORAL | Status: DC
Start: ? — End: 2018-03-17

## 2018-03-17 MED ORDER — GENERIC EXTERNAL MEDICATION
0.08 | Status: DC
Start: ? — End: 2018-03-17

## 2018-03-17 MED ORDER — ASPIRIN 325 MG PO TABS
325.00 | ORAL_TABLET | ORAL | Status: DC
Start: 2018-03-16 — End: 2018-03-17

## 2018-03-17 MED ORDER — ONDANSETRON HCL 4 MG PO TABS
4.00 | ORAL_TABLET | ORAL | Status: DC
Start: ? — End: 2018-03-17

## 2018-03-17 MED ORDER — OXYCODONE HCL 5 MG PO TABS
10.00 | ORAL_TABLET | ORAL | Status: DC
Start: ? — End: 2018-03-17

## 2018-03-17 MED ORDER — MORPHINE SULFATE (PF) 2 MG/ML IV SOLN
2.00 | INTRAVENOUS | Status: DC
Start: ? — End: 2018-03-17

## 2018-03-17 MED ORDER — ONDANSETRON HCL 4 MG/2ML IJ SOLN
4.00 | INTRAMUSCULAR | Status: DC
Start: ? — End: 2018-03-17

## 2018-03-17 MED ORDER — BISACODYL 10 MG RE SUPP
10.00 | RECTAL | Status: DC
Start: ? — End: 2018-03-17

## 2018-03-17 MED ORDER — POLYETHYLENE GLYCOL 3350 17 G PO PACK
17.00 | PACK | ORAL | Status: DC
Start: 2018-03-17 — End: 2018-03-17

## 2018-03-17 MED ORDER — BENZOCAINE-MENTHOL 15-3.6 MG MT LOZG
1.00 | LOZENGE | OROMUCOSAL | Status: DC
Start: ? — End: 2018-03-17

## 2018-03-17 MED ORDER — SENNA-DOCUSATE SODIUM 8.6-50 MG PO TABS
1.00 | ORAL_TABLET | ORAL | Status: DC
Start: 2018-03-16 — End: 2018-03-17

## 2018-03-24 DIAGNOSIS — M1611 Unilateral primary osteoarthritis, right hip: Secondary | ICD-10-CM | POA: Diagnosis not present

## 2018-03-24 DIAGNOSIS — R5381 Other malaise: Secondary | ICD-10-CM | POA: Diagnosis not present

## 2018-03-24 DIAGNOSIS — D6489 Other specified anemias: Secondary | ICD-10-CM | POA: Diagnosis not present

## 2018-03-30 DIAGNOSIS — Z9181 History of falling: Secondary | ICD-10-CM | POA: Diagnosis not present

## 2018-03-30 DIAGNOSIS — T84090D Other mechanical complication of internal right hip prosthesis, subsequent encounter: Secondary | ICD-10-CM | POA: Diagnosis not present

## 2018-04-10 DIAGNOSIS — M25561 Pain in right knee: Secondary | ICD-10-CM | POA: Diagnosis not present

## 2018-06-24 ENCOUNTER — Encounter: Payer: Self-pay | Admitting: Physician Assistant

## 2018-06-24 DIAGNOSIS — M25551 Pain in right hip: Secondary | ICD-10-CM | POA: Diagnosis not present

## 2018-06-29 NOTE — Progress Notes (Signed)
MEDICARE ANNUAL WELLNESS VISIT AND CPE  Assessment:    Encounter for Medicare annual wellness exam 1 year Declines MGM  Hypertension, unspecified type - continue medications, DASH diet, exercise and monitor at home. Call if greater than 130/80.  -     CBC with Differential/Platelet -     COMPLETE METABOLIC PANEL WITH GFR -     TSH -     Urinalysis, Routine w reflex microscopic -     Microalbumin / creatinine urine ratio  Elevated cholesterol -continue flax seed/fiber, check lipids, decrease fatty foods, increase activity.  -     Lipid panel  Vitamin D deficiency Continue supplement  BMI 26.0-26.9,adult - long discussion about weight loss, diet, and exercise -recommended diet heavy in fruits and veggies and low in animal meats, cheeses, and dairy products  Osteopenia, unspecified location Continue vitamin D/weight bearing exercise  Medication management  Venous stasis dermatitis of right lower extremity Varicose veins - weight loss discussed, continue compression stockings and elevation   1 year declines 6 month OV Declines preventative test Over 40 minutes of exam, counseling, chart review and critical decision making was performed No future appointments.   Plan:   During the course of the visit the patient was educated and counseled about appropriate screening and preventive services including:    Pneumococcal vaccine   Prevnar 13  Influenza vaccine  Td vaccine  Screening electrocardiogram  Bone densitometry screening  Colorectal cancer screening  Diabetes screening  Glaucoma screening  Nutrition counseling   Advanced directives: requested   Subjective:  Heather Barrera is a 73 y.o. female who presents for Medicare Annual Wellness Visit and complete physical.    She had hip revision 01/23 with Dr. Orson Aloe that has helped with her pain.   When she leans over for a prolonged period of time and stands she will have fast/strong heart beats  and she has noticed she has fast heart beats with the treadmill and decrease endurance. Mother has CHF,  Brother with MI, sister with AAA.  His blood pressure has been controlled at home, today their BP is BP: 122/80  He does workout. He denies chest pain, shortness of breath, dizziness. BMI is Body mass index is 25.37 kg/m., he is working on diet and exercise. She has been eating more with mother's day.  Wt Readings from Last 3 Encounters:  07/01/18 143 lb 3.2 oz (65 kg)  06/18/17 136 lb 9.6 oz (62 kg)  06/17/16 149 lb 6.4 oz (67.8 kg)    He is not on cholesterol medication and denies myalgias. His cholesterol is at goal. The cholesterol last visit was:   Lab Results  Component Value Date   CHOL 219 (H) 06/18/2017   HDL 68 06/18/2017   LDLCALC 136 (H) 06/18/2017   TRIG 59 06/18/2017   CHOLHDL 3.2 06/18/2017    He has been working on diet and exercise for prediabetes, and denies paresthesia of the feet, polydipsia, polyuria and visual disturbances. Last A1C in the office was:  Lab Results  Component Value Date   HGBA1C 5.2 10/05/2015   Last GFR:  Lab Results  Component Value Date   GFRNONAA 87 06/18/2017   Patient is on Vitamin D supplement, on 15,000  Lab Results  Component Value Date   VD25OH 44 12/15/2014   Medication Review: Current Outpatient Medications on File Prior to Visit  Medication Sig Dispense Refill  . Ascorbic Acid (VITA-C PO) Take by mouth.    Marland Kitchen aspirin 325 MG EC tablet  Take 325 mg by mouth daily.    . Cholecalciferol (VITAMIN D3) 10000 UNITS capsule Take 1 capsule (10,000 Units total) by mouth daily.    . Ferrous Fumarate (IRON) 18 MG TBCR Take 1 tablet by mouth daily.    Marland Kitchen. GARLIC PO Take 1 capsule by mouth daily.    . Multiple Vitamins-Minerals (MULTIVITAMIN PO) Take 1 tablet by mouth daily.    . naproxen sodium (ANAPROX) 220 MG tablet Take 220 mg by mouth 2 (two) times daily with a meal. PRN    . OVER THE COUNTER MEDICATION CoQ10 200 mg daily    . OVER  THE COUNTER MEDICATION Z Quil 1/2 ounce at hs    . OVER THE COUNTER MEDICATION Calcium-magnesium-zinc 1 tablet daily.    . vitamin E 400 UNIT capsule Take 400 Units by mouth daily.     No current facility-administered medications on file prior to visit.     Allergies  Allergen Reactions  . Penicillins Anaphylaxis  . Demerol [Meperidine] Nausea And Vomiting  . Betadine [Povidone Iodine] Rash    Current Problems (verified) Patient Active Problem List   Diagnosis Date Noted  . Hypertension 12/24/2014  . Elevated cholesterol 12/24/2014  . Other abnormal glucose 12/24/2014  . Vitamin D deficiency 12/24/2014    Screening Tests Immunization History  Administered Date(s) Administered  . Influenza, High Dose Seasonal PF 12/15/2014, 10/05/2015  . Pneumococcal Conjugate-13 10/05/2015  . Pneumococcal Polysaccharide-23 02/18/2009  . Zoster 02/18/2009    Preventative care: Last colonoscopy: 2010, had ruptured colon and had colectomy- would like a cologuard Last mammogram: 5-6 years declines- states she does not want it Last pap smear/pelvic exam: remote   DEXA: osteopenia 06/2016 -2.1  Prior vaccinations: TD or Tdap: declines  Influenza: 12/29/2017 at [facility]  Pneumococcal: 2011 Prevnar13: 2017 Shingles/Zostavax: 2011  Names of Other Physician/Practitioners you currently use: 1. [facility] here for primary care 2. [facility], eye doctor, last visit July 2019 3. Dr. Baruch Goutyolon, May 2019 Patient Care Team: Lucky CowboyMcKeown, William, MD as PCP - General (Internal Medicine)  SURGICAL HISTORY He  has a past surgical history that includes Shoulder arthroscopy (Left); Elbow arthrotomy (Left); Joint replacement (Right, 2010); Joint replacement (Left, 2012); Joint replacement (Right, 2013); thumb surg (Right); Toe Surgery (Bilateral); and Colectomy (2010). FAMILY HISTORY His family history includes AAA (abdominal aortic aneurysm) in his sister; Cancer in his  father and sister; Diabetes in his mother; Heart disease in his mother; Heart disease (age of onset: 8256) in his brother; Hyperlipidemia in his mother. SOCIAL HISTORY He  reports that he has never smoked. He has never used smokeless tobacco. He reports current alcohol use.   MEDICARE WELLNESS OBJECTIVES: Physical activity:   Cardiac risk factors:   Depression/mood screen:   [facility]  Decreased Interest 0  Down, Depressed, Hopeless 0  PHQ - 2 Score 0    ADLs:  No flowsheet data found.   Cognitive Testing  Alert? Yes  Normal Appearance?Yes  Oriented to person? Yes  Place? Yes   Time? Yes  Recall of three objects?  Yes  Can perform simple calculations? Yes  Displays appropriate judgment?Yes  Can read the correct time from a watch face?Yes  EOL planning: Does Patient Have a Medical Advance Directive?: Yes Type of Advance Directive: Healthcare Power of Attorney, Living will Copy of Healthcare Power of Attorney in Chart?: No - copy requested  Review of Systems  Constitutional: Negative.   HENT: Negative.  Eyes: Negative.   Respiratory: Negative.   Cardiovascular: Negative.   Gastrointestinal: Negative.   Genitourinary: Negative.   Musculoskeletal: Negative.   Skin: Negative.      Objective:     Today's Vitals   07/01/18 1403  BP: 122/80  Pulse: 76  Temp: 97.6 F (36.4 C)  SpO2: 98%  Weight: 143 lb 3.2 oz (65 kg)  Height: 5\' 3"  (1.6 m)   Body mass index is 25.37 kg/m.  General appearance: alert, no distress, WD/WN, adult HEENT: normocephalic, sclerae anicteric, TMs pearly, nares patent, no discharge or erythema, pharynx normal Oral cavity: MMM, no lesions Neck: supple, no lymphadenopathy, no thyromegaly, no masses Heart: RRR, normal S1, S2, no murmurs Lungs: CTA bilaterally, no wheezes, rhonchi, or rales Abdomen: +bs, soft, non tender, non distended, no masses, no hepatomegaly, no splenomegaly Musculoskeletal: nontender, no swelling,  no obvious deformity Extremities: no edema, no cyanosis, no clubbing Pulses: 2+ symmetric, upper and lower extremities, normal cap refill Neurological: alert, oriented x 3, CN2-12 intact, strength normal upper extremities and lower extremities, sensation normal throughout, DTRs 2+ throughout, no cerebellar signs, gait normal Psychiatric: normal affect, behavior normal, pleasant   EKG declines Medicare Attestation I have personally reviewed: The patient's medical and social history Their use of alcohol, tobacco or illicit drugs Their current medications and supplements The patient's functional ability including ADLs,fall risks, home safety risks, cognitive, and hearing and visual impairment Diet and physical activities Evidence for depression or mood disorders  The patient's weight, height, BMI, and visual acuity have been recorded in the chart.  I have made referrals, counseling, and provided education to the patient based on review of the above and I have provided the patient with a written personalized care plan for preventive services.     Quentin Mulling, PA-C   07/01/2018

## 2018-07-01 ENCOUNTER — Other Ambulatory Visit: Payer: Self-pay

## 2018-07-01 ENCOUNTER — Encounter: Payer: Self-pay | Admitting: Physician Assistant

## 2018-07-01 ENCOUNTER — Ambulatory Visit (INDEPENDENT_AMBULATORY_CARE_PROVIDER_SITE_OTHER): Payer: Medicare Other | Admitting: Physician Assistant

## 2018-07-01 VITALS — BP 122/80 | HR 76 | Temp 97.6°F | Ht 63.0 in | Wt 143.2 lb

## 2018-07-01 DIAGNOSIS — Z0001 Encounter for general adult medical examination with abnormal findings: Secondary | ICD-10-CM | POA: Diagnosis not present

## 2018-07-01 DIAGNOSIS — E611 Iron deficiency: Secondary | ICD-10-CM

## 2018-07-01 DIAGNOSIS — E538 Deficiency of other specified B group vitamins: Secondary | ICD-10-CM | POA: Diagnosis not present

## 2018-07-01 DIAGNOSIS — E559 Vitamin D deficiency, unspecified: Secondary | ICD-10-CM | POA: Diagnosis not present

## 2018-07-01 DIAGNOSIS — E7800 Pure hypercholesterolemia, unspecified: Secondary | ICD-10-CM

## 2018-07-01 DIAGNOSIS — R7309 Other abnormal glucose: Secondary | ICD-10-CM | POA: Diagnosis not present

## 2018-07-01 DIAGNOSIS — M8580 Other specified disorders of bone density and structure, unspecified site: Secondary | ICD-10-CM

## 2018-07-01 DIAGNOSIS — R6889 Other general symptoms and signs: Secondary | ICD-10-CM | POA: Diagnosis not present

## 2018-07-01 DIAGNOSIS — R002 Palpitations: Secondary | ICD-10-CM

## 2018-07-01 DIAGNOSIS — Z79899 Other long term (current) drug therapy: Secondary | ICD-10-CM

## 2018-07-01 DIAGNOSIS — I10 Essential (primary) hypertension: Secondary | ICD-10-CM | POA: Diagnosis not present

## 2018-07-01 DIAGNOSIS — Z0000 Encounter for general adult medical examination without abnormal findings: Secondary | ICD-10-CM

## 2018-07-01 NOTE — Patient Instructions (Addendum)
Exercise Stress Test  An exercise stress test is a test that is done to collect information about how your heart functions during exercise. The test is done while you are walking on a treadmill or using an exercise bike. The goal is to raise your heart rate and "stress" the heart. The heart is evaluated before, during, and after you exercise. An electrocardiogram (ECG) will be used to monitor the heart, and your blood pressure will also be monitored. In some cases, nuclear scanning or an ultrasound of the heart (echocardiogram) will also be done to evaluate your heart. An exercise stress test is done to look for coronary artery disease (CAD). The test may also be done to:  Evaluate your limits of exercise during cardiac rehabilitation.  Check for high blood pressure during exercise.  Check how well you can exercise after such treatments as coronary stenting or new medicines.  Check for problems with blood flow to your arms and legs during exercise. If you have an abnormal test result, this may mean that you are not getting enough blood flow to your heart during exercise. More testing may be needed to understand why your test was not normal. Tell a health care provider about:  Any allergies you have.  All medicines you are taking, including vitamins, herbs, eye drops, creams, and over-the-counter medicines.  Any blood disorders you have.  Any surgeries you have had.  Any medical conditions you have.  Whether you are pregnant or may be pregnant. What are the risks? Generally, this is a safe procedure. However, problems may occur, including:  Pain or pressure in the following areas: ? Chest. ? Jaw or neck. ? Between your shoulder blades. ? Down your left arm.  Dizziness or lightheadedness.  Shortness of breath.  Increased or irregular heartbeats.  Nausea or vomiting.  Heart attack (rare).  Life-threatening abnormal heart rhythm (rare). What happens before the  procedure?  Follow instructions from your health care provider about eating or drinking restrictions. ? You may be told to avoid all forms of caffeine for 24 hours before the test. This includes coffee, tea (even decaffeinated tea), caffeinated sodas, chocolate, cocoa, and certain pain medicines.  Ask your health care provider about: ? Taking over-the-counter medicines, vitamins, herbs, and supplements. ? Changing or stopping your regular medicines. This is especially important if you are taking diabetes medicines or beta-blocker medicines.  If you have diabetes, ask how you are to take your insulin or pills. It is common to adjust your insulin dose the morning of the test.  If you are taking beta-blocker medicines, it is important to talk to your health care provider about these medicines well before the date of your test. Taking beta-blocker medicines may interfere with the test. In some cases, these medicines may need to be changed or stopped 24 hours or more before the test.  If you wear a nitroglycerin patch, it may need to be removed prior to the test. Ask your health care provider if the patch should be removed before the test.  If you use an inhaler for any breathing condition, bring it with you to the test.  Do not apply lotions, powders, creams, or oils on your chest prior to the test.  Wear loose-fitting clothes and comfortable walking shoes.  Do not use any products that contain nicotine or tobacco, such as cigarettes and e-cigarettes, for 4 hours before the test or as told by your health care provider. If you need help quitting, ask your health  care provider. What happens during the procedure?  Multiple electrodes will be attached to your chest.  Multiple wires will be attached to the electrodes. These will transfer the electrical impulses from your heart to the ECG machine. Your heart will be monitored both at rest and while exercising.  If you are also having an  echocardiogram or nuclear scanning, images of your heart will be taken before and after you exercise.  A blood pressure cuff will be placed around your arm to measure your blood pressure throughout the test. You will feel it tighten and loosen throughout the test.  You will walk on a treadmill or use a stationary bike. If you cannot use these, you may be asked to turn a crank with your hands.  You will start at a slow pace or level on the exercise machine. The exercise difficulty will be slowly increased to raise your heart rate. In the case of a treadmill, the speed and incline will gradually be increased.  You may be asked to periodically breathe into a tube. This measures the gases you breathe out.  You will be asked how you are feeling throughout the test. You will be asked to rate your level of exertion.  Tell the staff right away if you feel: ? Chest pain. ? Dizziness. ? Shortness of breath. ? Too fatigued to continue. ? Pain or aching in your legs or arms.  You will exercise until you have symptoms or until you reach a target heart rate. The test will also be stopped if you have changes in your blood pressure or ECG readings, or if you develop an irregular heartbeat (arrhythmia). The procedure may vary among health care providers and hospitals. What happens after the procedure?  You will sit down and recover from the exercise. Your blood pressure, heart rate, and ECG will be monitored until you recover.  You may return to your normal schedule, including diet, activities, and medicines, unless your health care provider tells you otherwise.  It is up to you to get your test results. Ask your health care provider, or the department that is doing the test, when your results will be ready. Summary  An exercise stress test is a test that is done to collect information about how your heart functions during exercise.  This test is done to look for coronary artery disease  (CAD).  During this test, you will walk on a treadmill or use an exercise bike to raise your heart rate.  It is important to follow instructions from your health care provider about eating and drinking restrictions before the test. This may include avoiding caffeine and certain medicines before the test. This information is not intended to replace advice given to you by your health care provider. Make sure you discuss any questions you have with your health care provider. Document Released: 02/02/2000 Document Revised: 04/10/2016 Document Reviewed: 04/10/2016 Elsevier Interactive Patient Education  2019 [employer].   Mammogram A mammogram is an X-ray of the breasts that is done to check for abnormal changes. This procedure can screen for and detect any changes that may suggest breast cancer. A mammogram can also identify other changes and variations in the breast, such as:  Inflammation of the breast tissue (mastitis).  An infected area that contains a collection of pus (abscess).  A fluid-filled sac (cyst).  Fibrocystic changes. This is when breast tissue becomes denser, which can make the tissue feel rope-like or uneven under the skin.  Tumors that are  not cancerous (benign). Tell a health care provider about:  Any allergies you have.  If you have breast implants.  If you have had previous breast disease, biopsy, or surgery.  If you are breastfeeding.  Any possibility that you could be pregnant, if this applies.  If you are younger than age 75.  If you have a family history of breast cancer. What are the risks? Generally, this is a safe procedure. However, problems may occur, including:  Exposure to radiation. Radiation levels are very low with this test.  The results being misinterpreted.  The need for further tests.  The inability of the mammogram to detect certain cancers. What happens before the procedure?  Schedule your test about 1-2 weeks after your  menstrual period. This is usually when your breasts are the least tender.  If you have had a mammogram done at a different facility in the past, get the mammogram X-rays or have them sent to your current exam facility in order to compare them.  Wash your breasts and under your arms the day of the test.  Do not wear deodorants, perfumes, lotions, or powders anywhere on your body on the day of the test.  Remove any jewelry from your neck.  Wear clothes that you can change into and out of easily. What happens during the procedure?  You will undress from the waist up and put on a gown.  You will stand in front of the X-ray machine.  Each breast will be placed between two plastic or glass plates. The plates will compress your breast for a few seconds. Try to stay as relaxed as possible during the procedure. This does not cause any harm to your breasts and any discomfort you feel will be very brief.  X-rays will be taken from different angles of each breast. The procedure may vary among health care providers and hospitals. What happens after the procedure?  The mammogram will be examined by a specialist (radiologist).  You may need to repeat certain parts of the test, depending on the quality of the images. This is commonly done if the radiologist needs a better view of the breast tissue.  Ask when your test results will be ready. Make sure you get your test results.  You may resume your normal activities. Summary  A mammogram is an X-ray of the breasts that is done to check for abnormal changes. This procedure can screen for and detect any changes that may suggest breast cancer.  If you have had a mammogram done at a different facility in the past, get the mammogram X-rays or have them sent to your current exam facility in order to compare them.  Ask when your test results will be ready. Make sure you get your test results. This information is not intended to replace advice given to  you by your health care provider. Make sure you discuss any questions you have with your health care provider. Document Released: 02/02/2000 Document Revised: 09/19/2016 Document Reviewed: 04/15/2014 Elsevier Interactive Patient Education  2019 [employer]

## 2018-07-02 LAB — COMPLETE METABOLIC PANEL WITH GFR
AG Ratio: 1.8 (calc) (ref 1.0–2.5)
ALT: 20 U/L (ref 6–29)
AST: 31 U/L (ref 10–35)
Albumin: 4.4 g/dL (ref 3.6–5.1)
Alkaline phosphatase (APISO): 54 U/L (ref 37–153)
BUN: 11 mg/dL (ref 7–25)
CO2: 26 mmol/L (ref 20–32)
Calcium: 10 mg/dL (ref 8.6–10.4)
Chloride: 105 mmol/L (ref 98–110)
Creat: 0.8 mg/dL (ref 0.60–0.93)
GFR, Est African American: 84 mL/min/{1.73_m2} (ref >=60)
GFR, Est Non African American: 73 mL/min/{1.73_m2} (ref >=60)
Globulin: 2.5 g/dL (calc) (ref 1.9–3.7)
Glucose, Bld: 81 mg/dL (ref 65–99)
Potassium: 4.2 mmol/L (ref 3.5–5.3)
Sodium: 141 mmol/L (ref 135–146)
Total Bilirubin: 0.5 mg/dL (ref 0.2–1.2)
Total Protein: 6.9 g/dL (ref 6.1–8.1)

## 2018-07-02 LAB — URINALYSIS, ROUTINE W REFLEX MICROSCOPIC
Bilirubin Urine: NEGATIVE
Glucose, UA: NEGATIVE
Hgb urine dipstick: NEGATIVE
Leukocytes,Ua: NEGATIVE
Nitrite: NEGATIVE
Protein, ur: NEGATIVE
Specific Gravity, Urine: 1.009 (ref 1.001–1.03)
pH: <=5 (ref 5.0–8.0)

## 2018-07-02 LAB — CBC WITH DIFFERENTIAL/PLATELET
Absolute Monocytes: 567 cells/uL (ref 200–950)
Basophils Absolute: 31 cells/uL (ref 0–200)
Basophils Relative: 0.6 %
Eosinophils Absolute: 73 cells/uL (ref 15–500)
Eosinophils Relative: 1.4 %
HCT: 41.3 % (ref 35.0–45.0)
Hemoglobin: 13.5 g/dL (ref 11.7–15.5)
Lymphs Abs: 1019 cells/uL (ref 850–3900)
MCH: 29 pg (ref 27.0–33.0)
MCHC: 32.7 g/dL (ref 32.0–36.0)
MCV: 88.8 fL (ref 80.0–100.0)
MPV: 10.5 fL (ref 7.5–12.5)
Monocytes Relative: 10.9 %
Neutro Abs: 3510 cells/uL (ref 1500–7800)
Neutrophils Relative %: 67.5 %
Platelets: 311 10*3/uL (ref 140–400)
RBC: 4.65 10*6/uL (ref 3.80–5.10)
RDW: 14 % (ref 11.0–15.0)
Total Lymphocyte: 19.6 %
WBC: 5.2 10*3/uL (ref 3.8–10.8)

## 2018-07-02 LAB — IRON, TOTAL/TOTAL IRON BINDING CAP
%SAT: 24 % (calc) (ref 16–45)
Iron: 100 ug/dL (ref 45–160)
TIBC: 418 mcg/dL (calc) (ref 250–450)

## 2018-07-02 LAB — LIPID PANEL
Cholesterol: 208 mg/dL — ABNORMAL HIGH (ref <200)
HDL: 67 mg/dL (ref >=50)
LDL Cholesterol (Calc): 125 mg/dL (calc) — ABNORMAL HIGH
Non-HDL Cholesterol (Calc): 141 mg/dL (calc) — ABNORMAL HIGH (ref <130)
Total CHOL/HDL Ratio: 3.1 (calc) (ref <5.0)
Triglycerides: 64 mg/dL (ref <150)

## 2018-07-02 LAB — VITAMIN B12: Vitamin B-12: 783 pg/mL (ref 200–1100)

## 2018-07-02 LAB — VITAMIN D 25 HYDROXY (VIT D DEFICIENCY, FRACTURES): Vit D, 25-Hydroxy: 103 ng/mL — ABNORMAL HIGH (ref 30–100)

## 2018-07-02 LAB — TSH: TSH: 2.92 mIU/L (ref 0.40–4.50)

## 2018-07-02 LAB — MICROALBUMIN / CREATININE URINE RATIO
Creatinine, Urine: 37 mg/dL (ref 20–275)
Microalb Creat Ratio: 11 mcg/mg creat (ref <30)
Microalb, Ur: 0.4 mg/dL

## 2018-07-07 NOTE — Progress Notes (Signed)
Spoke with [employer] on May 18th 2020 about Mrs. Heather Barrera COLOGUARD results & the stool sample that was sent could not be tested due to it either not being enough or too much was sent.

## 2018-09-02 DIAGNOSIS — R002 Palpitations: Secondary | ICD-10-CM | POA: Diagnosis not present

## 2018-09-02 DIAGNOSIS — R0600 Dyspnea, unspecified: Secondary | ICD-10-CM | POA: Diagnosis not present

## 2018-09-02 DIAGNOSIS — I10 Essential (primary) hypertension: Secondary | ICD-10-CM | POA: Diagnosis not present

## 2018-10-09 DIAGNOSIS — I059 Rheumatic mitral valve disease, unspecified: Secondary | ICD-10-CM | POA: Diagnosis not present

## 2018-11-14 DIAGNOSIS — Z23 Encounter for immunization: Secondary | ICD-10-CM | POA: Diagnosis not present

## 2018-12-03 ENCOUNTER — Encounter: Payer: Self-pay | Admitting: Internal Medicine

## 2018-12-24 DIAGNOSIS — H527 Unspecified disorder of refraction: Secondary | ICD-10-CM | POA: Diagnosis not present

## 2018-12-24 DIAGNOSIS — H2513 Age-related nuclear cataract, bilateral: Secondary | ICD-10-CM | POA: Diagnosis not present

## 2018-12-24 DIAGNOSIS — H43393 Other vitreous opacities, bilateral: Secondary | ICD-10-CM | POA: Diagnosis not present

## 2019-07-12 DIAGNOSIS — M8580 Other specified disorders of bone density and structure, unspecified site: Secondary | ICD-10-CM | POA: Insufficient documentation

## 2019-07-12 DIAGNOSIS — E538 Deficiency of other specified B group vitamins: Secondary | ICD-10-CM | POA: Insufficient documentation

## 2019-07-12 DIAGNOSIS — Z79899 Other long term (current) drug therapy: Secondary | ICD-10-CM | POA: Insufficient documentation

## 2019-07-12 NOTE — Progress Notes (Deleted)
MEDICARE ANNUAL WELLNESS VISIT AND CPE  Assessment:    Encounter for Medicare annual wellness exam 1 year Declines MGM  Hypertension, unspecified type - continue medications, DASH diet, exercise and monitor at home. Call if greater than 130/80.  -     CBC with Differential/Platelet -     COMPLETE METABOLIC PANEL WITH GFR -     TSH -     Urinalysis, Routine w reflex microscopic -     Microalbumin / creatinine urine ratio  Elevated cholesterol -continue flax seed/fiber, check lipids, decrease fatty foods, increase activity.  -     Lipid panel  Vitamin D deficiency Continue supplement  BMI 26.0-26.9,adult - long discussion about weight loss, diet, and exercise -recommended diet heavy in fruits and veggies and low in animal meats, cheeses, and dairy products  Osteopenia, unspecified location Continue vitamin D/weight bearing exercise  Medication management  Venous stasis dermatitis of right lower extremity Varicose veins - weight loss discussed, continue compression stockings and elevation   1 year declines 6 month OV Declines preventative test Over 40 minutes of exam, counseling, chart review and critical decision making was performed Future Appointments  Date Time Provider [place] [facility]  07/13/2019  2:00 PM Vicie Mutters, PA-C [id number] None  07/12/2020  2:00 PM Vicie Mutters, PA-C [facility] None     Plan:   During the course of the visit the patient was educated and counseled about appropriate screening and preventive services including:    Pneumococcal vaccine   Prevnar 13  Influenza vaccine  Td vaccine  Screening electrocardiogram  Bone densitometry screening  Colorectal cancer screening  Diabetes screening  Glaucoma screening  Nutrition counseling   Advanced directives: requested   Subjective:  Heather Barrera is a 74 y.o. female who presents for Medicare Annual Wellness Visit and complete physical.    She had hip revision 01/23  with Dr. Koleen Nimrod that has helped with her pain.    She had a normal stress test with [facility] in [place] on 09/2018  His blood pressure has been controlled at home, today their BP is    He does workout. He denies chest pain, shortness of breath, dizziness. BMI is There is no height or weight on file to calculate BMI., he is working on diet and exercise. She has been eating more with mother's day.  Wt Readings from Last 3 Encounters:  07/01/18 143 lb 3.2 oz (65 kg)  06/18/17 136 lb 9.6 oz (62 kg)  06/17/16 149 lb 6.4 oz (67.8 kg)    He is not on cholesterol medication and denies myalgias. Mother has CHF,  Brother with MI, sister with AAA. Normal stress test 09/2018 His cholesterol is at goal. The cholesterol last visit was:   Lab Results  Component Value Date   CHOL 208 (H) 07/01/2018   HDL 67 07/01/2018   LDLCALC 125 (H) 07/01/2018   TRIG 64 07/01/2018   CHOLHDL 3.1 07/01/2018    He has been working on diet and exercise for prediabetes, and denies paresthesia of the feet, polydipsia, polyuria and visual disturbances. Last A1C in the office was:  Lab Results  Component Value Date   HGBA1C 5.2 10/05/2015   Last GFR:  Lab Results  Component Value Date   GFRNONAA 73 07/01/2018   Patient is on Vitamin D supplement, on 15,000  Lab Results  Component Value Date   VD25OH 103 (H) 07/01/2018   Medication Review: Current Outpatient Medications on File Prior to Visit  Medication Sig Dispense Refill  .  Ascorbic Acid (VITA-C PO) Take by mouth.    Marland Kitchen aspirin 325 MG EC tablet Take 325 mg by mouth daily.    . Cholecalciferol (VITAMIN D3) 10000 UNITS capsule Take 1 capsule (10,000 Units total) by mouth daily.    . Ferrous Fumarate (IRON) 18 MG TBCR Take 1 tablet by mouth daily.    Marland Kitchen GARLIC PO Take 1 capsule by mouth daily.    . Multiple Vitamins-Minerals (MULTIVITAMIN PO) Take 1 tablet by mouth daily.    . naproxen sodium (ANAPROX) 220 MG tablet Take 220 mg by mouth 2 (two)  times daily with a meal. PRN    . OVER THE COUNTER MEDICATION CoQ10 200 mg daily    . OVER THE COUNTER MEDICATION Z Quil 1/2 ounce at hs    . OVER THE COUNTER MEDICATION Calcium-magnesium-zinc 1 tablet daily.    . vitamin E 400 UNIT capsule Take 400 Units by mouth daily.     No current facility-administered medications on file prior to visit.    Allergies  Allergen Reactions  . Penicillins Anaphylaxis  . Demerol [Meperidine] Nausea And Vomiting  . Betadine [Povidone Iodine] Rash    Current Problems (verified) Patient Active Problem List   Diagnosis Date Noted  . Hypertension 12/24/2014  . Elevated cholesterol 12/24/2014  . Other abnormal glucose 12/24/2014  . Vitamin D deficiency 12/24/2014    Screening Tests Immunization History  Administered Date(s) Administered  . Influenza, High Dose Seasonal PF 12/15/2014, 10/05/2015  . Pneumococcal Conjugate-13 10/05/2015  . Pneumococcal Polysaccharide-23 02/18/2009  . Zoster 02/18/2009    Preventative care: Last colonoscopy: 2010, had ruptured colon and had colectomy- would like a cologuard Last mammogram: 5-6 years declines- states she does not want it Last pap smear/pelvic exam: remote   DEXA: osteopenia 06/2016 -2.1  Prior vaccinations: TD or Tdap: declines  Influenza: 12/29/2017 at [facility]  Pneumococcal: 2011 Prevnar13: 2017 Shingles/Zostavax: 2011  Names of Other Physician/Practitioners you currently use: 1. [facility] here for primary care 2. [facility], eye doctor, last visit July 2019 3. Dr. Baruch Gouty, May 2019 Patient Care Team: Lucky Cowboy, MD as PCP - General (Internal Medicine)  SURGICAL HISTORY He  has a past surgical history that includes Shoulder arthroscopy (Left); Elbow arthrotomy (Left); Joint replacement (Right, 2010); Joint replacement (Left, 2012); Joint replacement (Right, 2013); thumb surg (Right); Toe Surgery (Bilateral); and Colectomy (2010). FAMILY  HISTORY His family history includes AAA (abdominal aortic aneurysm) in his sister; Cancer in his father and sister; Diabetes in his mother; Heart disease in his mother; Heart disease (age of onset: 83) in his brother; Hyperlipidemia in his mother. SOCIAL HISTORY He  reports that he has never smoked. He has never used smokeless tobacco. He reports current alcohol use.   MEDICARE WELLNESS OBJECTIVES: Physical activity:   Cardiac risk factors:   Depression/mood screen:   Depression screen [facility] 2/9 07/01/2018  Decreased Interest 0  Down, Depressed, Hopeless 0  PHQ - 2 Score 0    ADLs:  No flowsheet data found.   Cognitive Testing  Alert? Yes  Normal Appearance?Yes  Oriented to person? Yes  Place? Yes   Time? Yes  Recall of three objects?  Yes  Can perform simple calculations? Yes  Displays appropriate judgment?Yes  Can read the correct time from a watch face?Yes  EOL planning:    Review of Systems  Constitutional: Negative.   HENT: Negative.   Eyes: Negative.   Respiratory: Negative.   Cardiovascular: Negative.   Gastrointestinal:  Negative.   Genitourinary: Negative.   Musculoskeletal: Negative.   Skin: Negative.      Objective:     There were no vitals filed for this visit. There is no height or weight on file to calculate BMI.  General appearance: alert, no distress, WD/WN, adult HEENT: normocephalic, sclerae anicteric, TMs pearly, nares patent, no discharge or erythema, pharynx normal Oral cavity: MMM, no lesions Neck: supple, no lymphadenopathy, no thyromegaly, no masses Heart: RRR, normal S1, S2, no murmurs Lungs: CTA bilaterally, no wheezes, rhonchi, or rales Abdomen: +bs, soft, non tender, non distended, no masses, no hepatomegaly, no splenomegaly Musculoskeletal: nontender, no swelling, no obvious deformity Extremities: no edema, no cyanosis, no clubbing Pulses: 2+ symmetric, upper and lower extremities, normal cap refill Neurological: alert, oriented x 3,  CN2-12 intact, strength normal upper extremities and lower extremities, sensation normal throughout, DTRs 2+ throughout, no cerebellar signs, gait normal Psychiatric: normal affect, behavior normal, pleasant   EKG declines  Medicare Attestation I have personally reviewed: The patient's medical and social history Their use of alcohol, tobacco or illicit drugs Their current medications and supplements The patient's functional ability including ADLs,fall risks, home safety risks, cognitive, and hearing and visual impairment Diet and physical activities Evidence for depression or mood disorders  The patient's weight, height, BMI, and visual acuity have been recorded in the chart.  I have made referrals, counseling, and provided education to the patient based on review of the above and I have provided the patient with a written personalized care plan for preventive services.     Quentin Mulling, PA-C   07/12/2019

## 2019-07-13 ENCOUNTER — Encounter: Payer: Medicare Other | Admitting: Physician Assistant

## 2019-09-14 ENCOUNTER — Other Ambulatory Visit: Payer: Self-pay

## 2020-07-12 ENCOUNTER — Encounter: Payer: PPO | Admitting: Adult Health

## 2020-10-26 ENCOUNTER — Ambulatory Visit: Payer: PPO | Admitting: Adult Health

## 2020-12-22 ENCOUNTER — Encounter: Payer: Self-pay | Admitting: Adult Health

## 2020-12-22 NOTE — Progress Notes (Deleted)
MEDICARE ANNUAL WELLNESS VISIT AND CPE ***  Assessment:    Encounter for Medicare annual wellness exam 1 year Declines MGM ***  Hypertension, unspecified type - continue medications, DASH diet, exercise and monitor at home. Call if greater than 130/80.  -     CBC with Differential/Platelet -     COMPLETE METABOLIC PANEL WITH GFR -     TSH -     Urinalysis, Routine w reflex microscopic -     Microalbumin / creatinine urine ratio  Elevated cholesterol -continue flax seed/fiber, check lipids, decrease fatty foods, increase activity.  -     Lipid panel  Vitamin D deficiency Continue supplement  BMI 26.0-26.9,adult - long discussion about weight loss, diet, and exercise -recommended diet heavy in fruits and veggies and low in animal meats, cheeses, and dairy products  Osteopenia, unspecified location Continue vitamin D/weight bearing exercise ***   1 year declines 6 month OV *** Declines preventative test  Over 40 minutes of exam, counseling, chart review and critical decision making was performed Future Appointments  Date Time Provider Department Center  12/26/2020 10:30 AM Judd Gaudier, NP [facility] None     Plan:   During the course of the visit the patient was educated and counseled about appropriate screening and preventive services including:   Pneumococcal vaccine  Prevnar 13 Influenza vaccine Td vaccine Screening electrocardiogram Bone densitometry screening Colorectal cancer screening Diabetes screening Glaucoma screening Nutrition counseling  Advanced directives: requested   Subjective:  Heather Barrera is a 75 y.o. female who presents for Medicare Annual Wellness Visit and complete physical. *** She has Hypertension; Elevated cholesterol; Other abnormal glucose; Vitamin D deficiency; Osteopenia; Medication management; and B12 deficiency on their problem list.  Last seen 2020, lost to follow up. ***  Mother has CHF,  Brother with MI, sister with  AAA. *** has she been screened?   BMI is There is no height or weight on file to calculate BMI., she {HAS HAS [id number]} been working on diet and exercise. Wt Readings from Last 3 Encounters:  07/01/18 143 lb 3.2 oz (65 kg)  06/18/17 136 lb 9.6 oz (62 kg)  06/17/16 149 lb 6.4 oz (67.8 kg)   Her blood pressure has been controlled at home, today their BP is    She does workout. She denies chest pain, shortness of breath, dizziness.   She is not on cholesterol medication and denies myalgias. Her cholesterol is at goal of LDL <130. The cholesterol last visit was:   Lab Results  Component Value Date   CHOL 208 (H) 07/01/2018   HDL 67 07/01/2018   LDLCALC 125 (H) 07/01/2018   TRIG 64 07/01/2018   CHOLHDL 3.1 07/01/2018    She has been working on diet and exercise for glucose management, and denies paresthesia of the feet, polydipsia, polyuria and visual disturbances.  Last A1C in the office was:  Lab Results  Component Value Date   HGBA1C 5.2 10/05/2015   Last GFR:  Lab Results  Component Value Date   GFRNONAA 73 07/01/2018   Patient is on Vitamin D supplement, on 15,000  Lab Results  Component Value Date   VD25OH 103 (H) 07/01/2018    Lab Results  Component Value Date   VITAMINB12 783 07/01/2018   Lab Results  Component Value Date   IRON 100 07/01/2018   TIBC 418 07/01/2018     Medication Review: Current Outpatient Medications on File Prior to Visit  Medication Sig Dispense Refill   Ascorbic Acid (  VITA-C PO) Take by mouth.     aspirin 325 MG EC tablet Take 325 mg by mouth daily.     Cholecalciferol (VITAMIN D3) 10000 UNITS capsule Take 1 capsule (10,000 Units total) by mouth daily.     Ferrous Fumarate (IRON) 18 MG TBCR Take 1 tablet by mouth daily.     GARLIC PO Take 1 capsule by mouth daily.     Multiple Vitamins-Minerals (MULTIVITAMIN PO) Take 1 tablet by mouth daily.     naproxen sodium (ANAPROX) 220 MG tablet Take 220 mg by mouth 2 (two) times daily with a  meal. PRN     OVER THE COUNTER MEDICATION CoQ10 200 mg daily     OVER THE COUNTER MEDICATION Z Quil 1/2 ounce at hs     OVER THE COUNTER MEDICATION Calcium-magnesium-zinc 1 tablet daily.     vitamin E 400 UNIT capsule Take 400 Units by mouth daily.     No current facility-administered medications on file prior to visit.    Allergies  Allergen Reactions   Penicillins Anaphylaxis   Demerol [Meperidine] Nausea And Vomiting   Betadine [Povidone Iodine] Rash    Current Problems (verified) Patient Active Problem List   Diagnosis Date Noted   Osteopenia 07/12/2019   Medication management 07/12/2019   B12 deficiency 07/12/2019   Hypertension 12/24/2014   Elevated cholesterol 12/24/2014   Other abnormal glucose 12/24/2014   Vitamin D deficiency 12/24/2014    Screening Tests Immunization History  Administered Date(s) Administered   Influenza, High Dose Seasonal PF 12/15/2014, 10/05/2015   Pneumococcal Conjugate-13 10/05/2015   Pneumococcal Polysaccharide-23 02/18/2009   Zoster, Live 02/18/2009    Preventative care: Last colonoscopy: 2010, had ruptured colon and had colectomy- would like a cologuard Last mammogram: 5-6 years declines- states she does not want it Last pap smear/pelvic exam: remote   DEXA: osteopenia 06/2016 -2.1  Prior vaccinations: TD or Tdap: declines  Influenza: 12/29/2017 at [facility]  Pneumococcal: 2011 Prevnar13: 2017 Shingles/Zostavax: 2011  Names of Other Physician/Practitioners you currently use: 1. [facility] here for primary care 2. Eye care, eye doctor, last visit July 2019 3. Dr. Baruch Gouty, May 2019 Patient Care Team: Lucky Cowboy, MD as PCP - General (Internal Medicine)  SURGICAL HISTORY She  has a past surgical history that includes Shoulder arthroscopy (Left); Elbow arthrotomy (Left); Joint replacement (Right, 2010); Joint replacement (Left, 2012); Joint replacement (Right, 2013); thumb surg (Right);  Toe Surgery (Bilateral); and Colectomy (2010). FAMILY HISTORY Her family history includes AAA (abdominal aortic aneurysm) in her sister; Cancer in her father and sister; Diabetes in her mother; Heart disease in her mother; Heart disease (age of onset: 27) in her brother; Hyperlipidemia in her mother. SOCIAL HISTORY She  reports that she has never smoked. She has never used smokeless tobacco. She reports current alcohol use.   MEDICARE WELLNESS OBJECTIVES: Physical activity:   Cardiac risk factors:   Depression/mood screen:   Depression screen [facility] 2/9 07/01/2018  Decreased Interest 0  Down, Depressed, Hopeless 0  PHQ - 2 Score 0    ADLs:  No flowsheet data found.   Cognitive Testing  Alert? Yes  Normal Appearance?Yes  Oriented to person? Yes  Place? Yes   Time? Yes  Recall of three objects?  Yes  Can perform simple calculations? Yes  Displays appropriate judgment?Yes  Can read the correct time from a watch face?Yes  EOL planning:    Review of Systems  Constitutional: Negative.  Negative for malaise/fatigue and weight  loss.  HENT: Negative.  Negative for hearing loss and tinnitus.   Eyes: Negative.  Negative for blurred vision and double vision.  Respiratory: Negative.  Negative for cough, sputum production, shortness of breath and wheezing.   Cardiovascular: Negative.  Negative for chest pain, palpitations, orthopnea, claudication, leg swelling and PND.  Gastrointestinal: Negative.  Negative for abdominal pain, blood in stool, constipation, diarrhea, heartburn, melena, nausea and vomiting.  Genitourinary: Negative.   Musculoskeletal: Negative.  Negative for falls, joint pain and myalgias.  Skin: Negative.  Negative for rash.  Neurological:  Negative for dizziness, tingling, sensory change, weakness and headaches.  Endo/Heme/Allergies:  Negative for polydipsia.  Psychiatric/Behavioral: Negative.  Negative for depression, memory loss, substance abuse and suicidal ideas. The  patient is not nervous/anxious and does not have insomnia.   All other systems reviewed and are negative.   Objective:     There were no vitals filed for this visit.  There is no height or weight on file to calculate BMI.  General appearance: alert, no distress, WD/WN, female HEENT: normocephalic, sclerae anicteric, TMs pearly, nares patent, no discharge or erythema, pharynx normal Oral cavity: MMM, no lesions Neck: supple, no lymphadenopathy, no thyromegaly, no masses Heart: RRR, normal S1, S2, no murmurs Lungs: CTA bilaterally, no wheezes, rhonchi, or rales Abdomen: +bs, soft, non tender, non distended, no masses, no hepatomegaly, no splenomegaly Musculoskeletal: nontender, no swelling, no obvious deformity Extremities: no edema, no cyanosis, no clubbing Pulses: 2+ symmetric, upper and lower extremities, normal cap refill Neurological: alert, oriented x 3, CN2-12 intact, strength normal upper extremities and lower extremities, sensation normal throughout, DTRs 2+ throughout, no cerebellar signs, gait normal Psychiatric: normal affect, behavior normal, pleasant   EKG declines Medicare Attestation I have personally reviewed: The patient's medical and social history Their use of alcohol, tobacco or illicit drugs Their current medications and supplements The patient's functional ability including ADLs,fall risks, home safety risks, cognitive, and hearing and visual impairment Diet and physical activities Evidence for depression or mood disorders  The patient's weight, height, BMI, and visual acuity have been recorded in the chart.  I have made referrals, counseling, and provided education to the patient based on review of the above and I have provided the patient with a written personalized care plan for preventive services.     Dan Maker, NP   12/22/2020

## 2020-12-26 ENCOUNTER — Ambulatory Visit: Payer: PPO | Admitting: Adult Health

## 2020-12-26 DIAGNOSIS — I10 Essential (primary) hypertension: Secondary | ICD-10-CM

## 2020-12-26 DIAGNOSIS — E538 Deficiency of other specified B group vitamins: Secondary | ICD-10-CM

## 2020-12-26 DIAGNOSIS — M8580 Other specified disorders of bone density and structure, unspecified site: Secondary | ICD-10-CM

## 2020-12-26 DIAGNOSIS — Z6825 Body mass index (BMI) 25.0-25.9, adult: Secondary | ICD-10-CM

## 2020-12-26 DIAGNOSIS — R7309 Other abnormal glucose: Secondary | ICD-10-CM

## 2020-12-26 DIAGNOSIS — Z79899 Other long term (current) drug therapy: Secondary | ICD-10-CM

## 2020-12-26 DIAGNOSIS — E559 Vitamin D deficiency, unspecified: Secondary | ICD-10-CM

## 2020-12-26 DIAGNOSIS — E7800 Pure hypercholesterolemia, unspecified: Secondary | ICD-10-CM

## 2020-12-26 DIAGNOSIS — Z8249 Family history of ischemic heart disease and other diseases of the circulatory system: Secondary | ICD-10-CM

## 2020-12-26 DIAGNOSIS — Z0000 Encounter for general adult medical examination without abnormal findings: Secondary | ICD-10-CM

## 2021-01-26 NOTE — Progress Notes (Signed)
MEDICARE ANNUAL WELLNESS VISIT AND FOLLOW UP  Assessment:    Annual Medicare Wellness Visit Due annually  Health maintenance reviewed  Declines MGM  Hypertension, unspecified type - continue medications, DASH diet, exercise and monitor at home. Call if greater than 130/80.  -     CBC with Differential/Platelet -     COMPLETE METABOLIC PANEL WITH GFR -     TSH -     Urinalysis, Routine w reflex microscopic -     Microalbumin / creatinine urine ratio -     EKG   Elevated cholesterol -continue flax seed/fiber, check lipids, decrease fatty foods, increase activity.  -     Lipid panel  Vitamin D deficiency Continue supplement  BMI 25 - long discussion about weight loss, diet, and exercise -recommended diet heavy in fruits and veggies and low in animal meats, cheeses, and dairy products  Osteopenia, unspecified location Continue vitamin D/weight bearing exercise Due follow up DEXA< order placed and number given to schedule  Colon cancer screening - patient declines a colonoscopy even though the risks and benefits were discussed at length. Colon cancer is 3rd most diagnosed cancer and 2nd leading cause of death in both men and women 64 years of age and older. Patient understands the risk of cancer and death with declining the test however they are willing to do cologuard screening instead. They understand that this is not as sensitive or specific as a colonoscopy and they are still recommended to get a colonoscopy. The cologuard will be sent out to their house.    Medication management    Orders Placed This Encounter  Procedures   DG Bone Density   CBC with Differential/Platelet   COMPLETE METABOLIC PANEL WITH GFR   Magnesium   TSH   Lipid panel   Hemoglobin A1c   VITAMIN D 25 Hydroxy (Vit-D Deficiency, Fractures)   Urinalysis, Routine w reflex microscopic   EKG 12-Lead    1 year declines 6 month OV  Over 40 minutes of exam, counseling, chart review and critical  decision making was performed Future Appointments  Date Time Provider Department Center  12/21/2021 10:00 AM Judd Gaudier, NP [facility] None     Plan:   During the course of the visit the patient was educated and counseled about appropriate screening and preventive services including:   Pneumococcal vaccine  Prevnar 13 Influenza vaccine Td vaccine Screening electrocardiogram Bone densitometry screening Colorectal cancer screening Diabetes screening Glaucoma screening Nutrition counseling  Advanced directives: requested   Subjective:  Heather Barrera is a 75 y.o. female who presents for Medicare Annual Wellness Visit and follow up. She has Hypertension; Elevated cholesterol; Other abnormal glucose; Vitamin D deficiency; Osteopenia; Medication management; and B12 deficiency on their problem list.  Last seen in office 07/01/2018 for AWV/CPE, declined 6 month follow up.  She has hx of hip revision 03/12/2018 with Dr. Orson Aloe that has helped with her pain, reports has resolved.   She was reporting reduced endurance, had normal stress ECHO in 06/2018 at [facility]   BMI is Body mass index is 25.51 kg/m., she has been working on diet and exercise, has gym in her basement, weights and home trampoline.  Wt Readings from Last 3 Encounters:  01/30/21 144 lb (65.3 kg)  07/01/18 143 lb 3.2 oz (65 kg)  06/18/17 136 lb 9.6 oz (62 kg)   Her blood pressure has been controlled at home, today their BP is BP: 130/72  She does workout. She denies chest pain, shortness of  breath, dizziness.   She is not on cholesterol medication and denies myalgias. Her cholesterol is not at goal. The cholesterol last visit was:   Lab Results  Component Value Date   CHOL 208 (H) 07/01/2018   HDL 67 07/01/2018   LDLCALC 125 (H) 07/01/2018   TRIG 64 07/01/2018   CHOLHDL 3.1 07/01/2018    She has been working on diet and exercise for glucose management, and denies paresthesia of the feet, polydipsia, polyuria  and visual disturbances. Last A1C in the office was:  Lab Results  Component Value Date   HGBA1C 5.2 10/05/2015   Last GFR:  Lab Results  Component Value Date   GFRNONAA 73 07/01/2018   Patient is on Vitamin D supplement, on 15,000  Lab Results  Component Value Date   VD25OH 103 (H) 07/01/2018      Medication Review: Current Outpatient Medications on File Prior to Visit  Medication Sig Dispense Refill   Ascorbic Acid (VITA-C PO) Take by mouth.     aspirin EC 81 MG tablet Take 81 mg by mouth daily. Swallow whole.     Cholecalciferol (VITAMIN D3) 10000 UNITS capsule Take 1 capsule (10,000 Units total) by mouth daily.     CHOLINE-INOSITOL-METHIONINE IM Inject into the muscle daily.     Ferrous Fumarate (IRON) 18 MG TBCR Take 1 tablet by mouth daily.     GARLIC PO Take 1 capsule by mouth daily.     Ginkgo Biloba 40 MG TABS Take by mouth daily.     KRILL OIL PO Take by mouth daily.     MILK THISTLE PO Take by mouth daily.     Multiple Vitamins-Minerals (MULTIVITAMIN PO) Take 1 tablet by mouth daily.     naproxen sodium (ANAPROX) 220 MG tablet Take 220 mg by mouth 2 (two) times daily with a meal. PRN     OVER THE COUNTER MEDICATION CoQ10 200 mg daily     OVER THE COUNTER MEDICATION Z Quil 1/2 ounce at hs     OVER THE COUNTER MEDICATION Calcium-magnesium-zinc 1 tablet daily.     vitamin E 400 UNIT capsule Take 400 Units by mouth daily.     No current facility-administered medications on file prior to visit.    Allergies  Allergen Reactions   Penicillins Anaphylaxis   Demerol [Meperidine] Nausea And Vomiting   Betadine [Povidone Iodine] Rash    Current Problems (verified) Patient Active Problem List   Diagnosis Date Noted   Osteopenia 07/12/2019   Medication management 07/12/2019   B12 deficiency 07/12/2019   Hypertension 12/24/2014   Elevated cholesterol 12/24/2014   Other abnormal glucose 12/24/2014   Vitamin D deficiency 12/24/2014    Screening  Tests Immunization History  Administered Date(s) Administered   Influenza, High Dose Seasonal PF 12/15/2014, 10/05/2015   Moderna SARS-COV2 Booster Vaccination 12/15/2019, 07/16/2020   Moderna Sars-Covid-2 Vaccination 03/20/2019, 04/19/2019   Pneumococcal Conjugate-13 10/05/2015   Pneumococcal Polysaccharide-23 02/18/2009   Zoster, Live 02/18/2009    Preventative care: Last colonoscopy: 2010, had ruptured colon and had colectomy- she ? Completed cologuard in 2019, will check results, reorder if never completed.  Last mammogram: Declines further, she would not pursue Last pap smear/pelvic exam: remote   DEXA: osteopenia 06/2016 -2.1 follow up ordered   Prior vaccinations: TD or Tdap: declines  Influenza: TODAY Pneumococcal: 2011 Prevnar13: 2017 Shingles/Zostavax: 2011 Covid 19: 2/2 moderna + booster 06/2020  Names of Other Physician/Practitioners you currently use: 1. [facility] here for  primary care 2. Eye care [place], has scheduled 02/15/2021 3. Dental: [employer], 2022, goes q6m  Patient Care Team: Lucky Cowboy, MD as PCP - General (Internal Medicine)  SURGICAL HISTORY She  has a past surgical history that includes Shoulder arthroscopy (Left); Elbow arthrotomy (Left); Joint replacement (Right, 2010); Joint replacement (Left, 2012); Joint replacement (Right, 2013); thumb surg (Right); Toe Surgery (Bilateral); and Colectomy (2010). FAMILY HISTORY Her family history includes AAA (abdominal aortic aneurysm) in her sister; Cancer in her father and sister; Diabetes in her mother; Heart attack (age of onset: 46) in her brother; Heart disease in her mother; Heart failure in her mother; Hyperlipidemia in her mother. SOCIAL HISTORY She  reports that she has never smoked. She has never used smokeless tobacco. She reports current alcohol use.   MEDICARE WELLNESS OBJECTIVES: Physical activity: Current Exercise Habits: Home exercise  routine, Type of exercise: treadmill;walking;strength training/weights, Time (Minutes): 45, Frequency (Times/Week): 7, Weekly Exercise (Minutes/Week): 315, Intensity: Mild, Exercise limited by: None identified Cardiac risk factors: Cardiac Risk Factors include: advanced age (>95men, >33 women);dyslipidemia;hypertension Depression/mood screen:   Depression screen [facility] 2/9 01/30/2021  Decreased Interest 0  Down, Depressed, Hopeless 0  PHQ - 2 Score 0    ADLs:  In your present state of health, do you have any difficulty performing the following activities: 01/30/2021  Hearing? Y  Comment has hearing aids, needs left ear cleaned.  Vision? N  Difficulty concentrating or making decisions? N  Walking or climbing stairs? N  Dressing or bathing? N  Doing errands, shopping? N  Some recent data might be hidden     Cognitive Testing  Alert? Yes  Normal Appearance?Yes  Oriented to person? Yes  Place? Yes   Time? Yes  Recall of three objects?  Yes  Can perform simple calculations? Yes  Displays appropriate judgment?Yes  Can read the correct time from a watch face?Yes  EOL planning: Does Patient Have a Medical Advance Directive?: Yes Type of Advance Directive: Healthcare Power of Attorney, Living will Does patient want to make changes to medical advance directive?: No - Patient declined Copy of Healthcare Power of Attorney in Chart?: No - copy requested  Review of Systems  Constitutional: Negative.  Negative for malaise/fatigue and weight loss.  HENT: Negative.  Negative for hearing loss and tinnitus.   Eyes: Negative.  Negative for blurred vision and double vision.  Respiratory: Negative.  Negative for cough, shortness of breath and wheezing.   Cardiovascular: Negative.  Negative for chest pain, palpitations, orthopnea, claudication and leg swelling.  Gastrointestinal: Negative.  Negative for abdominal pain, blood in stool, constipation, diarrhea, heartburn, melena, nausea and vomiting.   Genitourinary: Negative.   Musculoskeletal: Negative.  Negative for joint pain and myalgias.  Skin: Negative.  Negative for rash.  Neurological:  Negative for dizziness, tingling, sensory change, weakness and headaches.  Endo/Heme/Allergies:  Negative for polydipsia.  Psychiatric/Behavioral: Negative.    All other systems reviewed and are negative.   Objective:     Today's Vitals   01/30/21 1458  BP: 130/72  Pulse: 72  Temp: 97.9 F (36.6 C)  SpO2: 99%  Weight: 144 lb (65.3 kg)   Body mass index is 25.51 kg/m.  General appearance: alert, no distress, WD/WN, female HEENT: normocephalic, sclerae anicteric, TMs pearly, nares patent, no discharge or erythema, pharynx normal Oral cavity: MMM, no lesions Neck: supple, no lymphadenopathy, no thyromegaly, no masses Heart: RRR, normal S1, S2, no murmurs Lungs: CTA bilaterally, no wheezes, rhonchi, or rales Abdomen: +bs, soft,  non tender, non distended, no masses, no hepatomegaly, no splenomegaly Musculoskeletal: nontender, no swelling, no obvious deformity Extremities: no edema, no cyanosis, no clubbing Pulses: 2+ symmetric, upper and lower extremities, normal cap refill Neurological: alert, oriented x 3, CN2-12 intact, strength normal upper extremities and lower extremities, sensation normal throughout, DTRs 2+ throughout, no cerebellar signs, gait normal Psychiatric: normal affect, behavior normal, pleasant  Breasts: declines GU: defer, no concerns  EKG -NSR, NSCPT  Medicare Attestation I have personally reviewed: The patient's medical and social history Their use of alcohol, tobacco or illicit drugs Their current medications and supplements The patient's functional ability including ADLs,fall risks, home safety risks, cognitive, and hearing and visual impairment Diet and physical activities Evidence for depression or mood disorders  The patient's weight, height, BMI, and visual acuity have been recorded in the chart.  I  have made referrals, counseling, and provided education to the patient based on review of the above and I have provided the patient with a written personalized care plan for preventive services.     Dan Maker, NP   01/30/2021

## 2021-01-30 ENCOUNTER — Other Ambulatory Visit: Payer: Self-pay

## 2021-01-30 ENCOUNTER — Encounter: Payer: Self-pay | Admitting: Adult Health

## 2021-01-30 ENCOUNTER — Ambulatory Visit (INDEPENDENT_AMBULATORY_CARE_PROVIDER_SITE_OTHER): Payer: PPO | Admitting: Adult Health

## 2021-01-30 VITALS — BP 130/72 | HR 72 | Temp 97.9°F | Wt 144.0 lb

## 2021-01-30 DIAGNOSIS — R6889 Other general symptoms and signs: Secondary | ICD-10-CM | POA: Diagnosis not present

## 2021-01-30 DIAGNOSIS — E7800 Pure hypercholesterolemia, unspecified: Secondary | ICD-10-CM

## 2021-01-30 DIAGNOSIS — E559 Vitamin D deficiency, unspecified: Secondary | ICD-10-CM | POA: Diagnosis not present

## 2021-01-30 DIAGNOSIS — E538 Deficiency of other specified B group vitamins: Secondary | ICD-10-CM

## 2021-01-30 DIAGNOSIS — Z6825 Body mass index (BMI) 25.0-25.9, adult: Secondary | ICD-10-CM

## 2021-01-30 DIAGNOSIS — Z0001 Encounter for general adult medical examination with abnormal findings: Secondary | ICD-10-CM

## 2021-01-30 DIAGNOSIS — I10 Essential (primary) hypertension: Secondary | ICD-10-CM

## 2021-01-30 DIAGNOSIS — M8580 Other specified disorders of bone density and structure, unspecified site: Secondary | ICD-10-CM

## 2021-01-30 DIAGNOSIS — Z79899 Other long term (current) drug therapy: Secondary | ICD-10-CM

## 2021-01-30 DIAGNOSIS — R7309 Other abnormal glucose: Secondary | ICD-10-CM | POA: Diagnosis not present

## 2021-01-30 DIAGNOSIS — Z0000 Encounter for general adult medical examination without abnormal findings: Secondary | ICD-10-CM

## 2021-01-30 DIAGNOSIS — Z1211 Encounter for screening for malignant neoplasm of colon: Secondary | ICD-10-CM

## 2021-01-30 DIAGNOSIS — Z23 Encounter for immunization: Secondary | ICD-10-CM | POA: Diagnosis not present

## 2021-01-30 DIAGNOSIS — D649 Anemia, unspecified: Secondary | ICD-10-CM | POA: Diagnosis not present

## 2021-01-30 DIAGNOSIS — Z5320 Procedure and treatment not carried out because of patient's decision for unspecified reasons: Secondary | ICD-10-CM

## 2021-01-30 DIAGNOSIS — Z136 Encounter for screening for cardiovascular disorders: Secondary | ICD-10-CM

## 2021-01-30 NOTE — Patient Instructions (Addendum)
°  Heather Barrera , Thank you for taking time to come for your Medicare Wellness Visit. I appreciate your ongoing commitment to your health goals. Please review the following plan we discussed and let me know if I can assist you in the future.   These are the goals we discussed:  Goals      LDL CALC < 100        This is a list of the screening recommended for you and due dates:  Health Maintenance  Topic Date Due   COVID-19 Vaccine (3 - Moderna risk series) 08/13/2020   Flu Shot  09/18/2020   Zoster (Shingles) Vaccine (1 of 2) 04/30/2021*   Pneumonia Vaccine  Completed   Hepatitis C Screening: USPSTF Recommendation to screen - Ages 72-79 yo.  Completed   HPV Vaccine  Aged Out   DEXA scan (bone density measurement)  Discontinued   Tetanus Vaccine  Discontinued  *Topic was postponed. The date shown is not the original due date.     HOW TO SCHEDULE YOUR BONE DENSITY EXAM  [facility]  7 a.m.-6:30 p.m., Monday 7 a.m.-5 p.m., Tuesday-Friday Schedule an appointment by calling [phone].    Know what a healthy weight is for you (roughly BMI <25) and aim to maintain this  Aim for 7+ servings of fruits and vegetables daily  65-80+ fluid ounces of water or unsweet tea for healthy kidneys  Limit to max 1 drink of alcohol per day; avoid smoking/tobacco  Limit animal fats in diet for cholesterol and heart health - choose grass fed whenever available  Avoid highly processed foods, and foods high in saturated/trans fats  Aim for low stress - take time to unwind and care for your mental health  Aim for 150 min of moderate intensity exercise weekly for heart health, and weights twice weekly for bone health  Aim for 7-9 hours of sleep daily      A great goal to work towards is aiming to get in a serving daily of some of the most nutritionally dense foods - G- BOMBS daily

## 2021-02-01 ENCOUNTER — Other Ambulatory Visit: Payer: Self-pay | Admitting: Adult Health

## 2021-02-01 DIAGNOSIS — D509 Iron deficiency anemia, unspecified: Secondary | ICD-10-CM

## 2021-02-01 LAB — TEST AUTHORIZATION

## 2021-02-01 LAB — CBC WITH DIFFERENTIAL/PLATELET
Absolute Monocytes: 663 cells/uL (ref 200–950)
Basophils Absolute: 33 cells/uL (ref 0–200)
Basophils Relative: 0.5 %
Eosinophils Absolute: 78 cells/uL (ref 15–500)
Eosinophils Relative: 1.2 %
HCT: 38.6 % (ref 35.0–45.0)
Hemoglobin: 12 g/dL (ref 11.7–15.5)
Lymphs Abs: 1190 cells/uL (ref 850–3900)
MCH: 24.5 pg — ABNORMAL LOW (ref 27.0–33.0)
MCHC: 31.1 g/dL — ABNORMAL LOW (ref 32.0–36.0)
MCV: 78.8 fL — ABNORMAL LOW (ref 80.0–100.0)
MPV: 10.4 fL (ref 7.5–12.5)
Monocytes Relative: 10.2 %
Neutro Abs: 4537 cells/uL (ref 1500–7800)
Neutrophils Relative %: 69.8 %
Platelets: 359 10*3/uL (ref 140–400)
RBC: 4.9 10*6/uL (ref 3.80–5.10)
RDW: 17.1 % — ABNORMAL HIGH (ref 11.0–15.0)
Total Lymphocyte: 18.3 %
WBC: 6.5 10*3/uL (ref 3.8–10.8)

## 2021-02-01 LAB — TSH: TSH: 2.67 mIU/L (ref 0.40–4.50)

## 2021-02-01 LAB — URINALYSIS, ROUTINE W REFLEX MICROSCOPIC
Bilirubin Urine: NEGATIVE
Glucose, UA: NEGATIVE
Hgb urine dipstick: NEGATIVE
Ketones, ur: NEGATIVE
Leukocytes,Ua: NEGATIVE
Nitrite: NEGATIVE
Protein, ur: NEGATIVE
Specific Gravity, Urine: 1.009 (ref 1.001–1.035)
pH: 5.5 (ref 5.0–8.0)

## 2021-02-01 LAB — COMPLETE METABOLIC PANEL WITH GFR
AG Ratio: 1.4 (calc) (ref 1.0–2.5)
ALT: 18 U/L (ref 6–29)
AST: 22 U/L (ref 10–35)
Albumin: 4.2 g/dL (ref 3.6–5.1)
Alkaline phosphatase (APISO): 45 U/L (ref 37–153)
BUN: 11 mg/dL (ref 7–25)
CO2: 28 mmol/L (ref 20–32)
Calcium: 10.1 mg/dL (ref 8.6–10.4)
Chloride: 104 mmol/L (ref 98–110)
Creat: 0.64 mg/dL (ref 0.60–1.00)
Globulin: 2.9 g/dL (calc) (ref 1.9–3.7)
Glucose, Bld: 81 mg/dL (ref 65–99)
Potassium: 4.3 mmol/L (ref 3.5–5.3)
Sodium: 139 mmol/L (ref 135–146)
Total Bilirubin: 0.3 mg/dL (ref 0.2–1.2)
Total Protein: 7.1 g/dL (ref 6.1–8.1)
eGFR: 91 mL/min/{1.73_m2} (ref >=60)

## 2021-02-01 LAB — LIPID PANEL
Cholesterol: 190 mg/dL (ref <200)
HDL: 65 mg/dL (ref >=50)
LDL Cholesterol (Calc): 109 mg/dL (calc) — ABNORMAL HIGH
Non-HDL Cholesterol (Calc): 125 mg/dL (calc) (ref <130)
Total CHOL/HDL Ratio: 2.9 (calc) (ref <5.0)
Triglycerides: 71 mg/dL (ref <150)

## 2021-02-01 LAB — IRON,TIBC AND FERRITIN PANEL
%SAT: 5 % (calc) — ABNORMAL LOW (ref 16–45)
Ferritin: 6 ng/mL — ABNORMAL LOW (ref 16–288)
Iron: 21 ug/dL — ABNORMAL LOW (ref 45–160)
TIBC: 455 mcg/dL (calc) — ABNORMAL HIGH (ref 250–450)

## 2021-02-01 LAB — HEMOGLOBIN A1C
Hgb A1c MFr Bld: 5.8 % of total Hgb — ABNORMAL HIGH (ref <5.7)
Mean Plasma Glucose: 120 mg/dL
eAG (mmol/L): 6.6 mmol/L

## 2021-02-01 LAB — VITAMIN D 25 HYDROXY (VIT D DEFICIENCY, FRACTURES): Vit D, 25-Hydroxy: 73 ng/mL (ref 30–100)

## 2021-02-01 LAB — MAGNESIUM: Magnesium: 2.1 mg/dL (ref 1.5–2.5)

## 2021-02-26 ENCOUNTER — Other Ambulatory Visit: Payer: Self-pay

## 2021-02-26 DIAGNOSIS — D509 Iron deficiency anemia, unspecified: Secondary | ICD-10-CM

## 2021-02-26 DIAGNOSIS — Z1212 Encounter for screening for malignant neoplasm of rectum: Secondary | ICD-10-CM | POA: Diagnosis not present

## 2021-02-26 DIAGNOSIS — Z1211 Encounter for screening for malignant neoplasm of colon: Secondary | ICD-10-CM | POA: Diagnosis not present

## 2021-02-26 LAB — POC HEMOCCULT BLD/STL (HOME/3-CARD/SCREEN)
Card #2 Fecal Occult Blod, POC: NEGATIVE
Card #3 Fecal Occult Blood, POC: NEGATIVE
Fecal Occult Blood, POC: NEGATIVE

## 2021-07-12 ENCOUNTER — Encounter: Payer: PPO | Admitting: Adult Health

## 2021-07-26 ENCOUNTER — Encounter: Payer: PPO | Admitting: Adult Health

## 2021-08-14 ENCOUNTER — Encounter: Payer: PPO | Admitting: Adult Health

## 2021-08-22 ENCOUNTER — Other Ambulatory Visit: Payer: Medicare Other

## 2021-09-18 ENCOUNTER — Other Ambulatory Visit: Payer: Medicare Other

## 2021-09-26 DIAGNOSIS — M8580 Other specified disorders of bone density and structure, unspecified site: Secondary | ICD-10-CM | POA: Diagnosis not present

## 2021-09-26 DIAGNOSIS — M85852 Other specified disorders of bone density and structure, left thigh: Secondary | ICD-10-CM | POA: Diagnosis not present

## 2021-09-26 DIAGNOSIS — M8588 Other specified disorders of bone density and structure, other site: Secondary | ICD-10-CM | POA: Diagnosis not present

## 2021-09-27 ENCOUNTER — Encounter: Payer: Self-pay | Admitting: Nurse Practitioner

## 2021-09-27 ENCOUNTER — Ambulatory Visit (INDEPENDENT_AMBULATORY_CARE_PROVIDER_SITE_OTHER): Payer: PPO | Admitting: Nurse Practitioner

## 2021-09-27 VITALS — BP 120/60 | HR 72 | Temp 97.7°F | Ht 63.0 in | Wt 142.0 lb

## 2021-09-27 DIAGNOSIS — D509 Iron deficiency anemia, unspecified: Secondary | ICD-10-CM

## 2021-09-27 DIAGNOSIS — M8580 Other specified disorders of bone density and structure, unspecified site: Secondary | ICD-10-CM

## 2021-09-27 DIAGNOSIS — E559 Vitamin D deficiency, unspecified: Secondary | ICD-10-CM | POA: Diagnosis not present

## 2021-09-27 DIAGNOSIS — E7800 Pure hypercholesterolemia, unspecified: Secondary | ICD-10-CM

## 2021-09-27 DIAGNOSIS — Z79899 Other long term (current) drug therapy: Secondary | ICD-10-CM | POA: Diagnosis not present

## 2021-09-27 DIAGNOSIS — Z0000 Encounter for general adult medical examination without abnormal findings: Secondary | ICD-10-CM | POA: Diagnosis not present

## 2021-09-27 DIAGNOSIS — Z0001 Encounter for general adult medical examination with abnormal findings: Secondary | ICD-10-CM

## 2021-09-27 NOTE — Patient Instructions (Signed)

## 2021-09-27 NOTE — Progress Notes (Signed)
MEDICARE ANNUAL WELLNESS VISIT AND FOLLOW UP CPE  Assessment:    Annual Medicare Wellness Visit Due annually  Health maintenance reviewed  Encounter for general medical exam with abnormal findings Due Annually  Osteopenia, unspecified location Pursue a combination of weight-bearing exercises and strength training. Advised on fall prevention measures including proper lighting in all rooms, removal of area rugs and floor clutter, use of walking devices as deemed appropriate, avoidance of uneven walking surfaces. Smoking cessation and moderate alcohol consumption if applicable Consume [id number] to 1000 IU of vitamin D daily with a goal vitamin D serum value of 30 ng/mL or higher. Aim for 1000 to 1200 mg of elemental calcium daily through supplements and/or dietary sources. Continue screening biannually.    Elevated cholesterol Discussed lifestyle modifications. Recommended diet heavy in fruits and veggies, omega 3's. Decrease consumption of animal meats, cheeses, and dairy products. Remain active and exercise as tolerated. Continue to monitor. Check lipids/TSH  Vitamin D deficiency Continue supplement Check  vitamin d levels  IDA Recently controlled. Check CBC and anemia panel PRN   Medication management All medications discussed and reviewed in full. All questions and concerns regarding medications addressed.    Orders Placed This Encounter  Procedures   CBC with Differential/Platelet   COMPLETE METABOLIC PANEL WITH GFR   Lipid panel   VITAMIN D 25 Hydroxy (Vit-D Deficiency, Fractures)    Over 40 minutes of exam, counseling, chart review and critical decision making was performed Future Appointments  Date Time Provider [place]  01/15/2022  2:30 PM Darrol Jump, NP [facility] None  09/30/2022  3:00 PM Darrol Jump, NP [facility] None    Subjective:  Heather Barrera is a 76 y.o. female who presents for Medicare Annual Wellness Visit and follow up. She has  Elevated cholesterol; Vitamin D deficiency; Osteopenia; Medication management; and Iron deficiency anemia on their problem list.  Overall she reports feeling well.    Last seen in office 01/2021 for AWV/CPE, declined 6 month follow up.  She has hx of hip revision 03/12/2018 with Dr. Koleen Nimrod that has helped with her pain, reports has resolved.   She was reporting reduced endurance, had normal stress ECHO in 06/2018 at [facility]. Denies any new cardiac symptoms.  BMI is Body mass index is 25.15 kg/m., she has been working on diet and exercise, has gym in her basement, weights and home trampoline.  Wt Readings from Last 3 Encounters:  09/27/21 142 lb (64.4 kg)  01/30/21 144 lb (65.3 kg)  07/01/18 143 lb 3.2 oz (65 kg)   Her blood pressure has been controlled at home, today their BP is BP: 120/60  She does workout. She denies chest pain, shortness of breath, dizziness.   She is not on cholesterol medication and denies myalgias. Her cholesterol is not at goal. The cholesterol last visit was:   Lab Results  Component Value Date   CHOL 212 (H) 09/27/2021   HDL 62 09/27/2021   LDLCALC 130 (H) 09/27/2021   TRIG 102 09/27/2021   CHOLHDL 3.4 09/27/2021    She has been working on diet and exercise for glucose management, and denies paresthesia of the feet, polydipsia, polyuria and visual disturbances. Last A1C in the office was:  Lab Results  Component Value Date   HGBA1C 5.8 (H) 01/30/2021   Last GFR:  Lab Results  Component Value Date   GFRNONAA 73 07/01/2018   Patient is on Vitamin D supplement, on 15,000  Lab Results  Component Value Date   VD25OH 33  09/27/2021   Medication Review: Current Outpatient Medications on File Prior to Visit  Medication Sig Dispense Refill   Ascorbic Acid (VITA-C PO) Take by mouth.     aspirin EC 81 MG tablet Take 81 mg by mouth daily. Swallow whole.     Cholecalciferol (VITAMIN D3) 10000 UNITS capsule Take 1 capsule (10,000 Units total) by mouth  daily.     CHOLINE-INOSITOL-METHIONINE IM Inject into the muscle daily.     CHOLINE-INOSITOL-METHIONINE-FA PO Take by mouth daily.     Ferrous Fumarate (IRON) 18 MG TBCR Take 1 tablet by mouth daily.     GARLIC PO Take 1 capsule by mouth daily.     Ginkgo Biloba 40 MG TABS Take by mouth daily.     KRILL OIL PO Take by mouth daily.     MILK THISTLE PO Take by mouth daily.     Multiple Vitamins-Minerals (MULTIVITAMIN PO) Take 1 tablet by mouth daily.     naproxen sodium (ANAPROX) 220 MG tablet Take 220 mg by mouth 2 (two) times daily with a meal. PRN     OVER THE COUNTER MEDICATION CoQ10 200 mg daily     OVER THE COUNTER MEDICATION Z Quil 1/2 ounce at hs     OVER THE COUNTER MEDICATION Calcium-magnesium-zinc 1 tablet daily.     vitamin E 400 UNIT capsule Take 400 Units by mouth daily.     No current facility-administered medications on file prior to visit.    Allergies  Allergen Reactions   Penicillins Anaphylaxis   Demerol [Meperidine] Nausea And Vomiting   Betadine [Povidone Iodine] Rash    Current Problems (verified) Patient Active Problem List   Diagnosis Date Noted   Iron deficiency anemia 02/01/2021   Osteopenia 07/12/2019   Medication management 07/12/2019   Elevated cholesterol 12/24/2014   Vitamin D deficiency 12/24/2014    Screening Tests Immunization History  Administered Date(s) Administered   Influenza, High Dose Seasonal PF 12/15/2014, 10/05/2015, 01/30/2021   Moderna SARS-COV2 Booster Vaccination 12/15/2019, 07/16/2020   Moderna Sars-Covid-2 Vaccination 03/20/2019, 04/19/2019   Pneumococcal Conjugate-13 10/05/2015   Pneumococcal Polysaccharide-23 02/18/2009   Zoster, Live 02/18/2009    Preventative care: Last colonoscopy: 2010 - defers further Last mammogram: Declines further, she would not pursue Last pap smear/pelvic exam: remote   DEXA: osteopenia 06/2016 -2.1 follow up ordered   Prior vaccinations: TD or Tdap: declines  Influenza: Due  11/2021 Pneumococcal: 2011 Prevnar13: 2017 Shingles/Zostavax: 2011 Covid 19: 2/2 moderna + booster 06/2020  Names of Other Physician/Practitioners you currently use: 1. [facility] here for primary care 2. [facility] [place], has scheduled 02/15/2021 3. Dental: [employer], 2022, goes q76m  Patient Care Team: Unk Pinto, MD as PCP - General (Internal Medicine)  SURGICAL HISTORY She  has a past surgical history that includes Shoulder arthroscopy (Left); Elbow arthrotomy (Left); Joint replacement (Right, 2010); Joint replacement (Left, 2012); Joint replacement (Right, 2013); thumb surg (Right); Toe Surgery (Bilateral); and Colectomy (2010). FAMILY HISTORY Her family history includes AAA (abdominal aortic aneurysm) in her sister; Cancer in her father and sister; Diabetes in her mother; Heart attack (age of onset: 10) in her brother; Heart disease in her mother; Heart failure in her mother; Hyperlipidemia in her mother. SOCIAL HISTORY She  reports that she has never smoked. She has never used smokeless tobacco. She reports current alcohol use.   MEDICARE WELLNESS OBJECTIVES: Physical activity:   Cardiac risk factors: Cardiac Risk Factors include: advanced age (>72men, >59 women) Depression/mood  screen:      10/01/2021    1:40 PM  Depression screen PHQ 2/9  Decreased Interest 0  Down, Depressed, Hopeless 0  PHQ - 2 Score 0    ADLs:     10/01/2021    1:38 PM 01/30/2021    3:24 PM  In your present state of health, do you have any difficulty performing the following activities:  Hearing? 0 1  Comment  has hearing aids, needs left ear cleaned.  Vision? 0 0  Difficulty concentrating or making decisions? 0 0  Walking or climbing stairs? 0 0  Dressing or bathing? 0 0  Doing errands, shopping? 0 0  Preparing Food and eating ? N   Using the Toilet? N   In the past six months, have you accidently leaked urine? N   Do you have problems  with loss of bowel control? N   Managing your Medications? N   Managing your Finances? N   Housekeeping or managing your Housekeeping? N      Cognitive Testing  Alert? Yes  Normal Appearance?Yes  Oriented to person? Yes  Place? Yes   Time? Yes  Recall of three objects?  Yes  Can perform simple calculations? Yes  Displays appropriate judgment?Yes  Can read the correct time from a watch face?Yes  EOL planning: Does Patient Have a Medical Advance Directive?: No Would patient like information on creating a medical advance directive?: No - Patient declined  Review of Systems  Constitutional: Negative.  Negative for malaise/fatigue and weight loss.  HENT: Negative.  Negative for hearing loss and tinnitus.   Eyes: Negative.  Negative for blurred vision and double vision.  Respiratory: Negative.  Negative for cough, shortness of breath and wheezing.   Cardiovascular: Negative.  Negative for chest pain, palpitations, orthopnea, claudication and leg swelling.  Gastrointestinal: Negative.  Negative for abdominal pain, blood in stool, constipation, diarrhea, heartburn, melena, nausea and vomiting.  Genitourinary: Negative.   Musculoskeletal: Negative.  Negative for joint pain and myalgias.  Skin: Negative.  Negative for rash.  Neurological:  Negative for dizziness, tingling, sensory change, weakness and headaches.  Endo/Heme/Allergies:  Negative for polydipsia.  Psychiatric/Behavioral: Negative.    All other systems reviewed and are negative.    Objective:     Today's Vitals   09/27/21 1516  BP: 120/60  Pulse: 72  Temp: 97.7 F (36.5 C)  SpO2: 97%  Weight: 142 lb (64.4 kg)  Height: 5\' 3"  (1.6 m)   Body mass index is 25.15 kg/m.  General appearance: alert, no distress, WD/WN, female HEENT: normocephalic, sclerae anicteric, TMs pearly, nares patent, no discharge or erythema, pharynx normal Oral cavity: MMM, no lesions Neck: supple, no lymphadenopathy, no thyromegaly, no  masses Heart: RRR, normal S1, S2, no murmurs Lungs: CTA bilaterally, no wheezes, rhonchi, or rales Abdomen: +bs, soft, non tender, non distended, no masses, no hepatomegaly, no splenomegaly Musculoskeletal: nontender, no swelling, no obvious deformity Extremities: no edema, no cyanosis, no clubbing Pulses: 2+ symmetric, upper and lower extremities, normal cap refill Neurological: alert, oriented x 3, CN2-12 intact, strength normal upper extremities and lower extremities, sensation normal throughout, DTRs 2+ throughout, no cerebellar signs, gait normal Psychiatric: normal affect, behavior normal, pleasant  Breasts: declines GU: defer, no concerns  EKG -NSR, NSCPT  Medicare Attestation I have personally reviewed: The patient's medical and social history Their use of alcohol, tobacco or illicit drugs Their current medications and supplements The patient's functional ability including ADLs,fall risks, home safety risks, cognitive, and hearing  and visual impairment Diet and physical activities Evidence for depression or mood disorders  The patient's weight, height, BMI, and visual acuity have been recorded in the chart.  I have made referrals, counseling, and provided education to the patient based on review of the above and I have provided the patient with a written personalized care plan for preventive services.     Adela Glimpse, NP   10/01/2021

## 2021-09-28 LAB — LIPID PANEL
Cholesterol: 212 mg/dL — ABNORMAL HIGH (ref <200)
HDL: 62 mg/dL (ref >=50)
LDL Cholesterol (Calc): 130 mg/dL — ABNORMAL HIGH
Non-HDL Cholesterol (Calc): 150 mg/dL — ABNORMAL HIGH (ref <130)
Total CHOL/HDL Ratio: 3.4 (calc) (ref <5.0)
Triglycerides: 102 mg/dL (ref <150)

## 2021-09-28 LAB — COMPLETE METABOLIC PANEL WITH GFR
AG Ratio: 1.5 (calc) (ref 1.0–2.5)
ALT: 14 U/L (ref 6–29)
AST: 20 U/L (ref 10–35)
Albumin: 4.2 g/dL (ref 3.6–5.1)
Alkaline phosphatase (APISO): 45 U/L (ref 37–153)
BUN: 14 mg/dL (ref 7–25)
CO2: 26 mmol/L (ref 20–32)
Calcium: 10 mg/dL (ref 8.6–10.4)
Chloride: 104 mmol/L (ref 98–110)
Creat: 0.74 mg/dL (ref 0.60–1.00)
Globulin: 2.8 g/dL (calc) (ref 1.9–3.7)
Glucose, Bld: 88 mg/dL (ref 65–99)
Potassium: 4.4 mmol/L (ref 3.5–5.3)
Sodium: 139 mmol/L (ref 135–146)
Total Bilirubin: 0.3 mg/dL (ref 0.2–1.2)
Total Protein: 7 g/dL (ref 6.1–8.1)
eGFR: 83 mL/min/{1.73_m2} (ref >=60)

## 2021-09-28 LAB — CBC WITH DIFFERENTIAL/PLATELET
Absolute Monocytes: 655 cells/uL (ref 200–950)
Basophils Absolute: 41 cells/uL (ref 0–200)
Basophils Relative: 0.7 %
Eosinophils Absolute: 99 cells/uL (ref 15–500)
Eosinophils Relative: 1.7 %
HCT: 41.9 % (ref 35.0–45.0)
Hemoglobin: 13.2 g/dL (ref 11.7–15.5)
Lymphs Abs: 1253 cells/uL (ref 850–3900)
MCH: 26.5 pg — ABNORMAL LOW (ref 27.0–33.0)
MCHC: 31.5 g/dL — ABNORMAL LOW (ref 32.0–36.0)
MCV: 84.1 fL (ref 80.0–100.0)
MPV: 10.8 fL (ref 7.5–12.5)
Monocytes Relative: 11.3 %
Neutro Abs: 3753 cells/uL (ref 1500–7800)
Neutrophils Relative %: 64.7 %
Platelets: 313 10*3/uL (ref 140–400)
RBC: 4.98 10*6/uL (ref 3.80–5.10)
RDW: 15.8 % — ABNORMAL HIGH (ref 11.0–15.0)
Total Lymphocyte: 21.6 %
WBC: 5.8 10*3/uL (ref 3.8–10.8)

## 2021-09-28 LAB — VITAMIN D 25 HYDROXY (VIT D DEFICIENCY, FRACTURES): Vit D, 25-Hydroxy: 54 ng/mL (ref 30–100)

## 2021-12-21 ENCOUNTER — Ambulatory Visit: Payer: PPO | Admitting: Nurse Practitioner

## 2022-01-15 ENCOUNTER — Ambulatory Visit: Payer: PPO | Admitting: Nurse Practitioner

## 2022-01-31 ENCOUNTER — Ambulatory Visit: Payer: PPO | Admitting: Nurse Practitioner

## 2022-09-30 ENCOUNTER — Encounter: Payer: PPO | Admitting: Nurse Practitioner

## 2023-04-24 ENCOUNTER — Encounter: Payer: Self-pay | Admitting: *Deleted
